# Patient Record
Sex: Female | Born: 1975 | Race: White | Hispanic: No | Marital: Married | Smoking: Never smoker
Health system: Southern US, Community
[De-identification: ages and names within clinical notes are randomized; demographics above are authoritative.]

## PROBLEM LIST (undated history)

## (undated) DIAGNOSIS — T7840XA Allergy, unspecified, initial encounter: Secondary | ICD-10-CM

## (undated) DIAGNOSIS — Z8619 Personal history of other infectious and parasitic diseases: Secondary | ICD-10-CM

## (undated) DIAGNOSIS — R519 Headache, unspecified: Secondary | ICD-10-CM

## (undated) DIAGNOSIS — R51 Headache: Secondary | ICD-10-CM

## (undated) HISTORY — PX: LASER ABLATION: SHX1947

## (undated) HISTORY — DX: Allergy, unspecified, initial encounter: T78.40XA

## (undated) HISTORY — DX: Personal history of other infectious and parasitic diseases: Z86.19

## (undated) HISTORY — DX: Headache: R51

## (undated) HISTORY — DX: Headache, unspecified: R51.9

---
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
## 2015-11-03 DIAGNOSIS — M2240 Chondromalacia patellae, unspecified knee: Secondary | ICD-10-CM | POA: Insufficient documentation

## 2015-11-03 DIAGNOSIS — M25469 Effusion, unspecified knee: Secondary | ICD-10-CM | POA: Insufficient documentation

## 2016-01-16 ENCOUNTER — Ambulatory Visit (INDEPENDENT_AMBULATORY_CARE_PROVIDER_SITE_OTHER): Payer: BLUE CROSS/BLUE SHIELD | Admitting: Internal Medicine

## 2016-01-16 ENCOUNTER — Encounter: Payer: Self-pay | Admitting: Internal Medicine

## 2016-01-16 VITALS — BP 116/72 | HR 65 | Temp 98.6°F | Ht 64.25 in | Wt 156.5 lb

## 2016-01-16 DIAGNOSIS — M542 Cervicalgia: Secondary | ICD-10-CM | POA: Diagnosis not present

## 2016-01-16 DIAGNOSIS — D509 Iron deficiency anemia, unspecified: Secondary | ICD-10-CM | POA: Diagnosis not present

## 2016-01-16 DIAGNOSIS — J301 Allergic rhinitis due to pollen: Secondary | ICD-10-CM

## 2016-01-16 DIAGNOSIS — D508 Other iron deficiency anemias: Secondary | ICD-10-CM | POA: Insufficient documentation

## 2016-01-16 DIAGNOSIS — M25561 Pain in right knee: Secondary | ICD-10-CM | POA: Diagnosis not present

## 2016-01-16 DIAGNOSIS — J302 Other seasonal allergic rhinitis: Secondary | ICD-10-CM | POA: Insufficient documentation

## 2016-01-16 LAB — IBC PANEL
Iron: 20 ug/dL — ABNORMAL LOW (ref 42–145)
Saturation Ratios: 4.2 % — ABNORMAL LOW (ref 20.0–50.0)
Transferrin: 341 mg/dL (ref 212.0–360.0)

## 2016-01-16 LAB — FERRITIN: Ferritin: 12.2 ng/mL (ref 10.0–291.0)

## 2016-01-16 MED ORDER — METHOCARBAMOL 500 MG PO TABS: 500.0000 mg | ORAL_TABLET | Freq: Every evening | ORAL | 0 refills | Status: DC | PRN

## 2016-01-16 NOTE — Patient Instructions (Signed)

## 2016-01-16 NOTE — Assessment & Plan Note (Signed)
Advised her to take Claritin OTC prn

## 2016-01-16 NOTE — Progress Notes (Signed)
HPI  Pt presents to the clinic today to establish care and for management of the conditions listed below. She recently moved from [place], transferring care from Dr. Seward Grater at [facility].   Seasonal Allergies: Worse in the spring. She does not take anything OTC for this.  Right knee pain: This has been going on for months. She had an xray of her knee which was norma. She had a MRI which showed some fraying of a tendon. She is following with Dr. Sabra Heck. She is taking Meloxicam as prescribed and reports it has helped with some of the pain and swelling. PT has been ordered but she reports she wants the swelling to decrease more before starting this.   She also reports that her HGB  Was 8.4 ast week when she went to donate blood. She reports she has a history of anemia in the past, unknown cause. She started taking her Iron pills again 1 week ago. She denies constipation, heavy menses, blood in her stool or any other s/s of abnormal bleeding.   She also c/o neck pain. This started 2.5 weeks ago. The pain is located on the sides of her neck, and radiate into the base of her skull. She describes the pain as sharp and shooting. It causes her to wake up with a headache in the back of her head. She describes the headache as pressure. She denies dizziness or visual changes. The neck pain seems worse when she tries to lay down for sleep. She has not taken anything OTC for this. She denies any injury to the area that she is aware of.  Past Medical History:  Diagnosis Date  . Allergy   . Frequent headaches   . History of chicken pox     Current Outpatient Prescriptions  Medication Sig Dispense Refill  . ferrous sulfate 325 (65 FE) MG tablet Take 325 mg by mouth daily with breakfast.    . meloxicam (MOBIC) 15 MG tablet Take 15 mg by mouth daily.     No current facility-administered medications for this visit.     No Known Allergies  Family History  Problem Relation Age of Onset  .  Uterine cancer Mother   . Hyperlipidemia Father   . Heart disease Father   . Hypertension Father   . Diabetes Father   . Diabetes Maternal Grandfather   . Alcohol abuse Paternal Grandmother   . Breast cancer Paternal Grandmother     Social History   Social History  . Marital status: Married    Spouse name: N/A  . Number of children: N/A  . Years of education: N/A   Occupational History  . Not on file.   Social History Main Topics  . Smoking status: Never Smoker  . Smokeless tobacco: Never Used  . Alcohol use Yes     Comment: occasional  . Drug use: No  . Sexual activity: Not on file   Other Topics Concern  . Not on file   Social History Narrative  . No narrative on file    ROS:  Constitutional: Pt reports headache. Denies fever, malaise, fatigue, or abrupt weight changes.  HEENT: Denies eye pain, eye redness, ear pain, ringing in the ears, wax buildup, runny nose, nasal congestion, bloody nose, or sore throat. Respiratory: Denies difficulty breathing, shortness of breath, cough or sputum production.   Cardiovascular: Denies chest pain, chest tightness, palpitations or swelling in the hands or feet.  Gastrointestinal: Denies abdominal pain, bloating, constipation, diarrhea or blood  in the stool.  GU: Denies frequency, urgency, pain with urination, blood in urine, odor or discharge. Musculoskeletal: Pt reports right knee swelling and neck pain. Denies decrease in range of motion, difficulty with gait, or joint pain.  Skin: Denies redness, rashes, lesions or ulcercations.  Neurological: Denies dizziness, difficulty with memory, difficulty with speech or problems with balance and coordination.  Psych: Denies anxiety, depression, SI/HI.  No other specific complaints in a complete review of systems (except as listed in HPI above).  PE:  BP 116/72   Pulse 65   Temp 98.6 F (37 C) (Oral)   Ht 5' 4.25" (1.632 m)   Wt 156 lb 8 oz (71 kg)   LMP 01/14/2016   SpO2 99%    BMI 26.65 kg/m  Wt Readings from Last 3 Encounters:  01/16/16 156 lb 8 oz (71 kg)    General: Appears her stated age, well developed, well nourished in NAD. HEENT: Head: normal shape and size; Throat/Mouth: Teeth present, mucosa pink and moist, no lesions or ulcerations noted.  Neck: Neck supple, trachea midline. No masses, lumps or thyromegaly present.  Cardiovascular: Normal rate and rhythm. S1,S2 noted.  No murmur, rubs or gallops noted.  Pulmonary/Chest: Normal effort and positive vesicular breath sounds. No respiratory distress. No wheezes, rales or ronchi noted.  Musculoskeletal: Normal flexion, extension and rotation of the cervical spine. No bony tenderness noted over the spine. Pain with palpation of the paracervical muscles. Pain with palpation of the trapezius muscle. Normal flexion and extension of the right knee. Peripatellar joint swelling noted. No difficulty with gait. Neurological: Alert and oriented.   Psychiatric: Mood and affect normal. Behavior is normal. Judgment and thought content normal.    Assessment and Plan:  Neck pain:  Muscular in origin Continue Meloxicam Neck exercises given, you may want to get a massage eRx for Robaxin 500 mg QHS   Right knee pain:  She will continue to follow with Dr. Sabra Heck  Make an appt for your annual exam Continue Meloxicam Knee exercise given  Webb Silversmith, NP

## 2016-01-16 NOTE — Assessment & Plan Note (Signed)
CBC, IBC panel and ferritin today Continue iron supplement for now- discussed constipation precautions

## 2016-01-17 LAB — CBC WITH DIFFERENTIAL/PLATELET
Basophils Absolute: 0.1 10*3/uL (ref 0.0–0.1)
Basophils Relative: 1.1 % (ref 0.0–3.0)
Eosinophils Absolute: 0.2 10*3/uL (ref 0.0–0.7)
Eosinophils Relative: 3.9 % (ref 0.0–5.0)
HCT: 28 % — ABNORMAL LOW (ref 36.0–46.0)
Hemoglobin: 8.8 g/dL — ABNORMAL LOW (ref 12.0–15.0)
Lymphocytes Relative: 26.2 % (ref 12.0–46.0)
Lymphs Abs: 1.7 10*3/uL (ref 0.7–4.0)
MCHC: 31.4 g/dL (ref 30.0–36.0)
MCV: 70.1 fl — ABNORMAL LOW (ref 78.0–100.0)
Monocytes Absolute: 0.4 10*3/uL (ref 0.1–1.0)
Monocytes Relative: 6.2 % (ref 3.0–12.0)
Neutro Abs: 4 10*3/uL (ref 1.4–7.7)
Neutrophils Relative %: 62.6 % (ref 43.0–77.0)
Platelets: 303 10*3/uL (ref 150.0–400.0)
RBC: 4 Mil/uL (ref 3.87–5.11)
RDW: 19.1 % — ABNORMAL HIGH (ref 11.5–15.5)
WBC: 6.4 10*3/uL (ref 4.0–10.5)

## 2016-01-20 NOTE — Addendum Note (Signed)
Addended by: Lurlean Nanny on: 01/20/2016 12:42 PM   Modules accepted: Orders

## 2016-04-22 ENCOUNTER — Other Ambulatory Visit (INDEPENDENT_AMBULATORY_CARE_PROVIDER_SITE_OTHER): Payer: BLUE CROSS/BLUE SHIELD

## 2016-04-22 DIAGNOSIS — D509 Iron deficiency anemia, unspecified: Secondary | ICD-10-CM | POA: Diagnosis not present

## 2016-04-22 LAB — CBC WITH DIFFERENTIAL/PLATELET
Basophils Absolute: 0 10*3/uL (ref 0.0–0.1)
Basophils Relative: 0.9 % (ref 0.0–3.0)
Eosinophils Absolute: 0.3 10*3/uL (ref 0.0–0.7)
Eosinophils Relative: 5.6 % — ABNORMAL HIGH (ref 0.0–5.0)
HCT: 31.5 % — ABNORMAL LOW (ref 36.0–46.0)
Hemoglobin: 10.2 g/dL — ABNORMAL LOW (ref 12.0–15.0)
Lymphocytes Relative: 26.9 % (ref 12.0–46.0)
Lymphs Abs: 1.5 10*3/uL (ref 0.7–4.0)
MCHC: 32.4 g/dL (ref 30.0–36.0)
MCV: 77.9 fl — ABNORMAL LOW (ref 78.0–100.0)
Monocytes Absolute: 0.4 10*3/uL (ref 0.1–1.0)
Monocytes Relative: 7.6 % (ref 3.0–12.0)
Neutro Abs: 3.2 10*3/uL (ref 1.4–7.7)
Neutrophils Relative %: 59 % (ref 43.0–77.0)
Platelets: 207 10*3/uL (ref 150.0–400.0)
RBC: 4.04 Mil/uL (ref 3.87–5.11)
RDW: 17.5 % — ABNORMAL HIGH (ref 11.5–15.5)
WBC: 5.5 10*3/uL (ref 4.0–10.5)

## 2016-04-22 LAB — IBC PANEL
Iron: 22 ug/dL — ABNORMAL LOW (ref 42–145)
Saturation Ratios: 4.6 % — ABNORMAL LOW (ref 20.0–50.0)
Transferrin: 340 mg/dL (ref 212.0–360.0)

## 2017-05-01 DIAGNOSIS — M25561 Pain in right knee: Secondary | ICD-10-CM | POA: Insufficient documentation

## 2017-05-01 DIAGNOSIS — M79606 Pain in leg, unspecified: Secondary | ICD-10-CM | POA: Insufficient documentation

## 2017-05-05 ENCOUNTER — Other Ambulatory Visit: Payer: Self-pay | Admitting: Internal Medicine

## 2017-05-05 DIAGNOSIS — Z1231 Encounter for screening mammogram for malignant neoplasm of breast: Secondary | ICD-10-CM

## 2017-05-23 ENCOUNTER — Encounter: Payer: Self-pay | Admitting: Radiology

## 2017-05-23 ENCOUNTER — Ambulatory Visit
Admission: RE | Admit: 2017-05-23 | Discharge: 2017-05-23 | Disposition: A | Discharge status: Home / Self Care | Payer: BLUE CROSS/BLUE SHIELD | Source: Ambulatory Visit | Attending: Internal Medicine | Admitting: Internal Medicine

## 2017-05-23 DIAGNOSIS — Z1231 Encounter for screening mammogram for malignant neoplasm of breast: Secondary | ICD-10-CM | POA: Insufficient documentation

## 2017-06-02 ENCOUNTER — Other Ambulatory Visit: Payer: Self-pay | Admitting: *Deleted

## 2017-06-02 ENCOUNTER — Inpatient Hospital Stay
Admission: RE | Admit: 2017-06-02 | Discharge: 2017-06-02 | Disposition: A | Discharge status: Home / Self Care | Payer: Self-pay | Source: Ambulatory Visit | Attending: *Deleted | Admitting: *Deleted

## 2017-06-02 DIAGNOSIS — Z9289 Personal history of other medical treatment: Secondary | ICD-10-CM

## 2017-07-15 DIAGNOSIS — Z01419 Encounter for gynecological examination (general) (routine) without abnormal findings: Secondary | ICD-10-CM | POA: Diagnosis not present

## 2017-10-07 DIAGNOSIS — Z23 Encounter for immunization: Secondary | ICD-10-CM | POA: Diagnosis not present

## 2017-11-12 DIAGNOSIS — G5603 Carpal tunnel syndrome, bilateral upper limbs: Secondary | ICD-10-CM | POA: Diagnosis not present

## 2017-11-14 DIAGNOSIS — L814 Other melanin hyperpigmentation: Secondary | ICD-10-CM | POA: Diagnosis not present

## 2017-11-14 DIAGNOSIS — Z1283 Encounter for screening for malignant neoplasm of skin: Secondary | ICD-10-CM | POA: Diagnosis not present

## 2017-11-14 DIAGNOSIS — D229 Melanocytic nevi, unspecified: Secondary | ICD-10-CM | POA: Diagnosis not present

## 2017-11-20 DIAGNOSIS — G5603 Carpal tunnel syndrome, bilateral upper limbs: Secondary | ICD-10-CM | POA: Diagnosis not present

## 2017-11-21 DIAGNOSIS — G5603 Carpal tunnel syndrome, bilateral upper limbs: Secondary | ICD-10-CM | POA: Diagnosis not present

## 2017-12-04 DIAGNOSIS — L290 Pruritus ani: Secondary | ICD-10-CM | POA: Diagnosis not present

## 2018-01-28 DIAGNOSIS — L819 Disorder of pigmentation, unspecified: Secondary | ICD-10-CM | POA: Diagnosis not present

## 2018-01-28 DIAGNOSIS — L739 Follicular disorder, unspecified: Secondary | ICD-10-CM | POA: Diagnosis not present

## 2018-04-01 DIAGNOSIS — L739 Follicular disorder, unspecified: Secondary | ICD-10-CM | POA: Diagnosis not present

## 2018-04-01 DIAGNOSIS — L819 Disorder of pigmentation, unspecified: Secondary | ICD-10-CM | POA: Diagnosis not present

## 2018-04-01 DIAGNOSIS — L814 Other melanin hyperpigmentation: Secondary | ICD-10-CM | POA: Diagnosis not present

## 2018-04-21 ENCOUNTER — Other Ambulatory Visit: Payer: Self-pay | Admitting: Internal Medicine

## 2018-04-21 DIAGNOSIS — Z1231 Encounter for screening mammogram for malignant neoplasm of breast: Secondary | ICD-10-CM

## 2018-05-04 DIAGNOSIS — Z0000 Encounter for general adult medical examination without abnormal findings: Secondary | ICD-10-CM | POA: Diagnosis not present

## 2018-05-04 DIAGNOSIS — Z79899 Other long term (current) drug therapy: Secondary | ICD-10-CM | POA: Diagnosis not present

## 2018-05-04 DIAGNOSIS — E7800 Pure hypercholesterolemia, unspecified: Secondary | ICD-10-CM | POA: Diagnosis not present

## 2018-05-04 DIAGNOSIS — D508 Other iron deficiency anemias: Secondary | ICD-10-CM | POA: Diagnosis not present

## 2018-05-04 DIAGNOSIS — F5104 Psychophysiologic insomnia: Secondary | ICD-10-CM | POA: Insufficient documentation

## 2018-05-25 DIAGNOSIS — D508 Other iron deficiency anemias: Secondary | ICD-10-CM | POA: Diagnosis not present

## 2018-06-17 DIAGNOSIS — D508 Other iron deficiency anemias: Secondary | ICD-10-CM | POA: Diagnosis not present

## 2018-06-18 DIAGNOSIS — D508 Other iron deficiency anemias: Secondary | ICD-10-CM | POA: Diagnosis not present

## 2018-08-17 ENCOUNTER — Ambulatory Visit
Admission: RE | Admit: 2018-08-17 | Discharge: 2018-08-17 | Disposition: A | Discharge status: Home / Self Care | Payer: 59 | Source: Ambulatory Visit | Attending: Internal Medicine | Admitting: Internal Medicine

## 2018-08-17 ENCOUNTER — Other Ambulatory Visit: Payer: Self-pay

## 2018-08-17 DIAGNOSIS — Z1231 Encounter for screening mammogram for malignant neoplasm of breast: Secondary | ICD-10-CM | POA: Diagnosis not present

## 2018-10-21 ENCOUNTER — Other Ambulatory Visit (INDEPENDENT_AMBULATORY_CARE_PROVIDER_SITE_OTHER): Payer: Self-pay | Admitting: Nurse Practitioner

## 2018-10-21 DIAGNOSIS — I8311 Varicose veins of right lower extremity with inflammation: Secondary | ICD-10-CM

## 2018-10-22 ENCOUNTER — Encounter (INDEPENDENT_AMBULATORY_CARE_PROVIDER_SITE_OTHER): Payer: Self-pay | Admitting: Nurse Practitioner

## 2018-10-22 ENCOUNTER — Ambulatory Visit (INDEPENDENT_AMBULATORY_CARE_PROVIDER_SITE_OTHER): Payer: 59

## 2018-10-22 ENCOUNTER — Ambulatory Visit (INDEPENDENT_AMBULATORY_CARE_PROVIDER_SITE_OTHER): Payer: 59 | Admitting: Nurse Practitioner

## 2018-10-22 ENCOUNTER — Other Ambulatory Visit: Payer: Self-pay

## 2018-10-22 VITALS — BP 119/78 | HR 72 | Resp 12 | Ht 64.0 in | Wt 159.0 lb

## 2018-10-22 DIAGNOSIS — E7800 Pure hypercholesterolemia, unspecified: Secondary | ICD-10-CM | POA: Diagnosis not present

## 2018-10-22 DIAGNOSIS — I8311 Varicose veins of right lower extremity with inflammation: Secondary | ICD-10-CM | POA: Diagnosis not present

## 2018-10-22 DIAGNOSIS — I8312 Varicose veins of left lower extremity with inflammation: Secondary | ICD-10-CM

## 2018-10-22 DIAGNOSIS — M25561 Pain in right knee: Secondary | ICD-10-CM | POA: Diagnosis not present

## 2018-10-22 NOTE — Progress Notes (Signed)
SUBJECTIVE:  Patient ID: Elizabeth Grant, female    DOB: [birth year], 43 y.o.   MRN: [id number] Chief Complaint  Patient presents with  . New Patient (Initial Visit)    HPI  Elizabeth Grant is a 43 y.o. female The patient is seen for evaluation of symptomatic varicose veins.  She is referred by her primary care physician Dr. Doy Hutching.  The patient relates burning and stinging which worsened steadily throughout the course of the day, particularly with standing. The patient also notes an aching and throbbing pain over the varicosities, particularly with prolonged dependent positions. The symptoms are significantly improved with elevation.  The patient also notes that during hot weather the symptoms are greatly intensified. The patient states the pain from the varicose veins interferes with work, daily exercise, shopping and household maintenance. At this point, the symptoms are persistent and severe enough that they're having a negative impact on lifestyle and are interfering with daily activities.  There is no history of DVT, PE or superficial thrombophlebitis. There is no history of ulceration or hemorrhage. The patient denies a significant family history of varicose veins. OB history: 3 children, most recent pregnancy 10 years ago  The patient has not worn graduated compression in the past. At the present time the patient has not been using over-the-counter analgesics. There is no history of prior surgical intervention or sclerotherapy.    Past Medical History:  Diagnosis Date  . Allergy   . Frequent headaches   . History of chicken pox     Past Surgical History:  Procedure Laterality Date  . TONSILLECTOMY AND ADENOIDECTOMY  1982    Social History   Socioeconomic History  . Marital status: Married    Spouse name: Not on file  . Number of children: Not on file  . Years of education: Not on file  . Highest education level: Not on file  Occupational History  . Not on file  Social  Needs  . Financial resource strain: Not on file  . Food insecurity    Worry: Not on file    Inability: Not on file  . Transportation needs    Medical: Not on file    Non-medical: Not on file  Tobacco Use  . Smoking status: Never Smoker  . Smokeless tobacco: Never Used  Substance and Sexual Activity  . Alcohol use: Yes    Comment: occasional  . Drug use: No  . Sexual activity: Yes  Lifestyle  . Physical activity    Days per week: Not on file    Minutes per session: Not on file  . Stress: Not on file  Relationships  . Social Herbalist on phone: Not on file    Gets together: Not on file    Attends religious service: Not on file    Active member of club or organization: Not on file    Attends meetings of clubs or organizations: Not on file    Relationship status: Not on file  . Intimate partner violence    Fear of current or ex partner: Not on file    Emotionally abused: Not on file    Physically abused: Not on file    Forced sexual activity: Not on file  Other Topics Concern  . Not on file  Social History Narrative  . Not on file    Family History  Problem Relation Age of Onset  . Uterine cancer Mother   . Hyperlipidemia Father   . Heart disease Father   .  Hypertension Father   . Diabetes Father   . Diabetes Maternal Grandfather   . Alcohol abuse Paternal Grandmother   . Breast cancer Paternal Grandmother     No Known Allergies   Review of Systems   Review of Systems: Negative Unless Checked Constitutional: [] Weight loss  [] Fever  [] Chills Cardiac: [] Chest pain   []  Atrial Fibrillation  [] Palpitations   [] Shortness of breath when laying flat   [] Shortness of breath with exertion. [] Shortness of breath at rest Vascular:  [] Pain in legs with walking   [] Pain in legs with standing [] Pain in legs when laying flat   [] Claudication    [] Pain in feet when laying flat    [] History of DVT   [] Phlebitis   [x] Swelling in legs   [x] Varicose veins    [] Non-healing ulcers Pulmonary:   [] Uses home oxygen   [] Productive cough   [] Hemoptysis   [] Wheeze  [] COPD   [] Asthma Neurologic:  [] Dizziness   [] Seizures  [] Blackouts [] History of stroke   [] History of TIA  [] Aphasia   [] Temporary Blindness   [] Weakness or numbness in arm   [] Weakness or numbness in leg Musculoskeletal:   [] Joint swelling   [] Joint pain   [] Low back pain  []  History of Knee Replacement [x] Arthritis [] back Surgeries  []  Spinal Stenosis    Hematologic:  [] Easy bruising  [] Easy bleeding   [] Hypercoagulable state   [] Anemic Gastrointestinal:  [] Diarrhea   [] Vomiting  [] Gastroesophageal reflux/heartburn   [] Difficulty swallowing. [] Abdominal pain Genitourinary:  [] Chronic kidney disease   [] Difficult urination  [] Anuric   [] Blood in urine [] Frequent urination  [] Burning with urination   [] Hematuria Skin:  [] Rashes   [] Ulcers [] Wounds Psychological:  [] History of anxiety   []  History of major depression  []  Memory Difficulties      OBJECTIVE:   Physical Exam  BP 119/78 (BP Location: Left Arm, Patient Position: Sitting, Cuff Size: Normal)   Pulse 72   Resp 12   Ht 5\' 4"  (1.626 m)   Wt 159 lb (72.1 kg)   BMI 27.29 kg/m   Gen: WD/WN, NAD Head: [place]/AT, No temporalis wasting.  Ear/Nose/Throat: Hearing grossly intact, nares w/o erythema or drainage Eyes: PER, EOMI, sclera nonicteric.  Neck: Supple, no masses.  No JVD.  Pulmonary:  Good air movement, no use of accessory muscles.  Cardiac: RRR Vascular: scattered varicosities present bilaterally.  Mild venous stasis changes to the legs bilaterally.  2+ soft edema  Vessel Right Left  Radial Palpable Palpable  Dorsalis Pedis Palpable Palpable  Posterior Tibial Palpable Palpable   Gastrointestinal: soft, non-distended. No guarding/no peritoneal signs.  Musculoskeletal: M/S 5/5 throughout.  No deformity or atrophy.  Neurologic: Pain and light touch intact in extremities.  Symmetrical.  Speech is fluent. Motor exam as listed  above. Psychiatric: Judgment intact, Mood & affect appropriate for pt's clinical situation. Dermatologic: No Venous rashes. No Ulcers Noted.  No changes consistent with cellulitis. Lymph : No Cervical lymphadenopathy, no lichenification or skin changes of chronic lymphedema.       ASSESSMENT AND PLAN:  1. Varicose veins of both lower extremities with inflammation  Recommend:  The patient has large symptomatic varicose veins that are painful and associated with swelling.  I have had a long discussion with the patient regarding  varicose veins and why they cause symptoms.  Patient will begin wearing graduated compression stockings class 1 on a daily basis, beginning first thing in the morning and removing them in the evening. The patient is instructed specifically not  to sleep in the stockings.    The patient  will also begin using over-the-counter analgesics such as Motrin 600 mg po TID to help control the symptoms.    In addition, behavioral modification including elevation during the day will be initiated.    Pending the results of these changes the  patient will be reevaluated in three months.   An  ultrasound of the venous system will be obtained.   Further plans will be based on the ultrasound results and whether conservative therapies are successful at eliminating the pain and swelling.   2. Pure hypercholesterolemia Continue statin as ordered and reviewed, no changes at this time   3. Arthralgia of right knee Continue NSAID medications as already ordered, these medications have been reviewed and there are no changes at this time.  Continued activity and therapy was stressed.    Current Outpatient Medications on File Prior to Visit  Medication Sig Dispense Refill  . Calcium-Vitamin D 500-125 MG-UNIT TABS Take by mouth.    . ferrous sulfate 325 (65 FE) MG tablet Take 325 mg by mouth daily with breakfast.    . meloxicam (MOBIC) 15 MG tablet Take 15 mg by mouth daily. PRN      No current facility-administered medications on file prior to visit.     There are no Patient Instructions on file for this visit. Return in about 3 months (around 01/21/2019) for Varicose veins.   Kris Hartmann, NP  This note was completed with Sales executive.  Any errors are purely unintentional.

## 2018-12-23 DIAGNOSIS — G5603 Carpal tunnel syndrome, bilateral upper limbs: Secondary | ICD-10-CM | POA: Insufficient documentation

## 2019-02-01 ENCOUNTER — Ambulatory Visit (INDEPENDENT_AMBULATORY_CARE_PROVIDER_SITE_OTHER): Payer: 59 | Admitting: Vascular Surgery

## 2019-02-01 ENCOUNTER — Other Ambulatory Visit: Payer: Self-pay

## 2019-02-01 ENCOUNTER — Encounter (INDEPENDENT_AMBULATORY_CARE_PROVIDER_SITE_OTHER): Payer: Self-pay | Admitting: Vascular Surgery

## 2019-02-01 VITALS — BP 118/78 | HR 77 | Resp 16 | Wt 166.0 lb

## 2019-02-01 DIAGNOSIS — I872 Venous insufficiency (chronic) (peripheral): Secondary | ICD-10-CM | POA: Diagnosis not present

## 2019-02-01 DIAGNOSIS — E7800 Pure hypercholesterolemia, unspecified: Secondary | ICD-10-CM | POA: Diagnosis not present

## 2019-02-01 DIAGNOSIS — M79605 Pain in left leg: Secondary | ICD-10-CM | POA: Diagnosis not present

## 2019-02-01 DIAGNOSIS — M79604 Pain in right leg: Secondary | ICD-10-CM

## 2019-02-01 NOTE — Progress Notes (Signed)
MRN : [id number]  Elizabeth Grant is a 43 y.o. (05-Dec-1975) female who presents with chief complaint of  Chief Complaint  Patient presents with  . Follow-up    73month follow up  .  History of Present Illness:   The patient returns for followup evaluation more than 3 months after the initial visit. The patient continues to have pain in the lower extremities with dependency. The pain is lessened with elevation. Graduated compression stockings, Class I (20-30 mmHg), have been worn but the stockings do not eliminate the leg pain. Over-the-counter analgesics do not improve the symptoms. The degree of discomfort continues to interfere with daily activities. The patient notes the pain in the legs is causing problems with daily exercise, at the workplace and even with household activities and maintenance such as standing in the kitchen preparing meals and doing dishes.   Venous ultrasound shows normal deep venous system, no evidence of acute or chronic DVT.  Superficial reflux is present in the bilateral Great Saphenous Veins  Current Meds  Medication Sig  . Calcium-Vitamin D 500-125 MG-UNIT TABS Take by mouth.  . ferrous sulfate 325 (65 FE) MG tablet Take 325 mg by mouth daily with breakfast.  . meloxicam (MOBIC) 15 MG tablet Take 15 mg by mouth daily. PRN    Past Medical History:  Diagnosis Date  . Allergy   . Frequent headaches   . History of chicken pox     Past Surgical History:  Procedure Laterality Date  . TONSILLECTOMY AND ADENOIDECTOMY  1982    Social History Social History   Tobacco Use  . Smoking status: Never Smoker  . Smokeless tobacco: Never Used  Substance Use Topics  . Alcohol use: Yes    Comment: occasional  . Drug use: No    Family History Family History  Problem Relation Age of Onset  . Uterine cancer Mother   . Hyperlipidemia Father   . Heart disease Father   . Hypertension Father   . Diabetes Father   . Diabetes Maternal Grandfather   . Alcohol  abuse Paternal Grandmother   . Breast cancer Paternal Grandmother     No Known Allergies   REVIEW OF SYSTEMS (Negative unless checked)  Constitutional: [] Weight loss  [] Fever  [] Chills Cardiac: [] Chest pain   [] Chest pressure   [] Palpitations   [] Shortness of breath when laying flat   [] Shortness of breath with exertion. Vascular:  [] Pain in legs with walking   [x] Pain in legs at rest  [] History of DVT   [] Phlebitis   [x] Swelling in legs   [x] Varicose veins   [] Non-healing ulcers Pulmonary:   [] Uses home oxygen   [] Productive cough   [] Hemoptysis   [] Wheeze  [] COPD   [] Asthma Neurologic:  [] Dizziness   [] Seizures   [] History of stroke   [] History of TIA  [] Aphasia   [] Vissual changes   [] Weakness or numbness in arm   [] Weakness or numbness in leg Musculoskeletal:   [] Joint swelling   [] Joint pain   [] Low back pain Hematologic:  [] Easy bruising  [] Easy bleeding   [] Hypercoagulable state   [] Anemic Gastrointestinal:  [] Diarrhea   [] Vomiting  [] Gastroesophageal reflux/heartburn   [] Difficulty swallowing. Genitourinary:  [] Chronic kidney disease   [] Difficult urination  [] Frequent urination   [] Blood in urine Skin:  [x] Rashes   [] Ulcers  Psychological:  [] History of anxiety   []  History of major depression.  Physical Examination  Vitals:   02/01/19 1504  BP: 118/78  Pulse: 77  Resp: 16  Weight: 166 lb (75.3 kg)   Body mass index is 28.49 kg/m. Gen: WD/WN, NAD Head: [place]/AT, No temporalis wasting.  Ear/Nose/Throat: Hearing grossly intact, nares w/o erythema or drainage Eyes: PER, EOMI, sclera nonicteric.  Neck: Supple, no large masses.   Pulmonary:  Good air movement, no audible wheezing bilaterally, no use of accessory muscles.  Cardiac: RRR, no JVD Vascular: Large varicosities present extensively greater than 10 mm bilaterally.  Mild to moderate venous stasis changes to the legs bilaterally.  2-3+ soft pitting edema Vessel Right Left  PT Palpable Palpable  DP Palpable Palpable    Gastrointestinal: Non-distended. No guarding/no peritoneal signs.  Musculoskeletal: M/S 5/5 throughout.  No deformity or atrophy.  Neurologic: CN 2-12 intact. Symmetrical.  Speech is fluent. Motor exam as listed above. Psychiatric: Judgment intact, Mood & affect appropriate for pt's clinical situation. Dermatologic: No rashes or ulcers noted.  No changes consistent with cellulitis. Lymph : No lichenification or skin changes of chronic lymphedema.  CBC Lab Results  Component Value Date   WBC 5.5 04/22/2016   HGB 10.2 (L) 04/22/2016   HCT 31.5 (L) 04/22/2016   MCV 77.9 (L) 04/22/2016   PLT 207.0 04/22/2016    BMET No results found for: NA, K, CL, CO2, GLUCOSE, BUN, CREATININE, CALCIUM, GFRNONAA, GFRAA CrCl cannot be calculated (No successful lab value found.).  COAG No results found for: INR, PROTIME  Radiology No results found.   Assessment/Plan 1. Chronic venous insufficiency Recommend  I have reviewed my previous  discussion with the patient regarding  varicose veins and why they cause symptoms. Patient will continue  wearing graduated compression stockings class 1 on a daily basis, beginning first thing in the morning and removing them in the evening.    In addition, behavioral modification including elevation during the day was again discussed and this will continue.  The patient has utilized over the counter pain medications and has been exercising.  However, at this time conservative therapy has not alleviated the patient's symptoms of leg pain and swelling  Recommend: laser ablation of the right and then left great saphenous veins to eliminate the symptoms of pain and swelling of the lower extremities caused by the severe superficial venous reflux disease.   2. Pain in both lower extremities See #1  3. Pure hypercholesterolemia Continue statin as ordered and reviewed, no changes at this time    Hortencia Pilar, MD  02/01/2019 3:16 PM

## 2019-02-28 ENCOUNTER — Encounter (INDEPENDENT_AMBULATORY_CARE_PROVIDER_SITE_OTHER): Payer: Self-pay | Admitting: Vascular Surgery

## 2019-02-28 DIAGNOSIS — I872 Venous insufficiency (chronic) (peripheral): Secondary | ICD-10-CM | POA: Insufficient documentation

## 2019-04-01 ENCOUNTER — Other Ambulatory Visit: Payer: Self-pay

## 2019-04-01 ENCOUNTER — Ambulatory Visit (INDEPENDENT_AMBULATORY_CARE_PROVIDER_SITE_OTHER): Payer: 59 | Admitting: Vascular Surgery

## 2019-04-01 ENCOUNTER — Encounter (INDEPENDENT_AMBULATORY_CARE_PROVIDER_SITE_OTHER): Payer: Self-pay | Admitting: Vascular Surgery

## 2019-04-01 DIAGNOSIS — I8312 Varicose veins of left lower extremity with inflammation: Secondary | ICD-10-CM

## 2019-04-01 DIAGNOSIS — I8311 Varicose veins of right lower extremity with inflammation: Secondary | ICD-10-CM | POA: Diagnosis not present

## 2019-04-01 NOTE — Progress Notes (Signed)
    MRN : [id number]  Elizabeth Grant is a 44 y.o. (20-Jan-1976) female who presents with chief complaint of painful varicose veins.    The patient's right lower extremity was sterilely prepped and draped.  The ultrasound machine was used to visualize the great saphenous vein throughout its course.  A segment at the knee level was selected for access.  The saphenous vein was accessed without difficulty using ultrasound guidance with a micropuncture needle.   An 0.018  wire was placed beyond the saphenofemoral junction through the sheath and the microneedle was removed.  The 65 cm sheath was then placed over the wire and the wire and dilator were removed.  The laser fiber was placed through the sheath and its tip was placed approximately 2 cm below the saphenofemoral junction.  Tumescent anesthesia was then created with a dilute lidocaine solution.  Laser energy was then delivered with constant withdrawal of the sheath and laser fiber.  Approximately 1391 joules of energy were delivered over a length of 35 cm.  Sterile dressings were placed.  The patient tolerated the procedure well without complications.

## 2019-04-05 ENCOUNTER — Other Ambulatory Visit (INDEPENDENT_AMBULATORY_CARE_PROVIDER_SITE_OTHER): Payer: Self-pay | Admitting: Vascular Surgery

## 2019-04-05 ENCOUNTER — Other Ambulatory Visit: Payer: Self-pay

## 2019-04-05 ENCOUNTER — Ambulatory Visit (INDEPENDENT_AMBULATORY_CARE_PROVIDER_SITE_OTHER): Payer: 59

## 2019-04-05 DIAGNOSIS — Z9889 Other specified postprocedural states: Secondary | ICD-10-CM | POA: Diagnosis not present

## 2019-04-22 ENCOUNTER — Other Ambulatory Visit: Payer: Self-pay

## 2019-04-22 ENCOUNTER — Encounter (INDEPENDENT_AMBULATORY_CARE_PROVIDER_SITE_OTHER): Payer: Self-pay | Admitting: Vascular Surgery

## 2019-04-22 ENCOUNTER — Ambulatory Visit (INDEPENDENT_AMBULATORY_CARE_PROVIDER_SITE_OTHER): Payer: 59 | Admitting: Vascular Surgery

## 2019-04-22 VITALS — BP 106/71 | HR 80 | Resp 6 | Ht 64.0 in | Wt 164.0 lb

## 2019-04-22 DIAGNOSIS — I8311 Varicose veins of right lower extremity with inflammation: Secondary | ICD-10-CM | POA: Diagnosis not present

## 2019-04-22 DIAGNOSIS — I8312 Varicose veins of left lower extremity with inflammation: Secondary | ICD-10-CM

## 2019-04-23 ENCOUNTER — Encounter (INDEPENDENT_AMBULATORY_CARE_PROVIDER_SITE_OTHER): Payer: Self-pay | Admitting: Vascular Surgery

## 2019-04-23 NOTE — Progress Notes (Signed)
    MRN : [id number]  Mumina Highbaugh is a 44 y.o. (19-Jun-1975) female who presents with chief complaint of painful varicose veins.    The patient's left lower extremity was sterilely prepped and draped.  The ultrasound machine was used to visualize the left great saphenous saphenous vein throughout its course.  A segment of the great saphenous vein below the knee was selected for access.  The saphenous vein was accessed without difficulty using ultrasound guidance with a micropuncture needle.   An 0.018  wire was placed beyond the saphenofemoral junction through the sheath and the microneedle was removed.  The 65 cm sheath was then placed over the wire and the wire and dilator were removed.  The laser fiber was placed through the sheath and its tip was placed approximately 2 cm below the saphenofemoral junction.  Tumescent anesthesia was then created with a dilute lidocaine solution.  Laser energy was then delivered with constant withdrawal of the sheath and laser fiber.  Approximately 1641 joules of energy were delivered over a length of 31 cm.  Sterile dressings were placed.  The patient tolerated the procedure well without complications.

## 2019-04-26 ENCOUNTER — Other Ambulatory Visit: Payer: Self-pay

## 2019-04-26 ENCOUNTER — Other Ambulatory Visit (INDEPENDENT_AMBULATORY_CARE_PROVIDER_SITE_OTHER): Payer: Self-pay | Admitting: Vascular Surgery

## 2019-04-26 ENCOUNTER — Ambulatory Visit (INDEPENDENT_AMBULATORY_CARE_PROVIDER_SITE_OTHER): Payer: 59

## 2019-04-26 DIAGNOSIS — I83812 Varicose veins of left lower extremities with pain: Secondary | ICD-10-CM

## 2019-05-17 ENCOUNTER — Ambulatory Visit (INDEPENDENT_AMBULATORY_CARE_PROVIDER_SITE_OTHER): Payer: 59 | Admitting: Vascular Surgery

## 2019-05-17 ENCOUNTER — Encounter (INDEPENDENT_AMBULATORY_CARE_PROVIDER_SITE_OTHER): Payer: Self-pay | Admitting: Vascular Surgery

## 2019-05-17 ENCOUNTER — Other Ambulatory Visit: Payer: Self-pay

## 2019-05-17 VITALS — BP 119/81 | HR 76 | Resp 16 | Wt 167.0 lb

## 2019-05-17 DIAGNOSIS — I872 Venous insufficiency (chronic) (peripheral): Secondary | ICD-10-CM | POA: Diagnosis not present

## 2019-05-17 DIAGNOSIS — E7800 Pure hypercholesterolemia, unspecified: Secondary | ICD-10-CM | POA: Diagnosis not present

## 2019-05-17 DIAGNOSIS — I8312 Varicose veins of left lower extremity with inflammation: Secondary | ICD-10-CM

## 2019-05-17 DIAGNOSIS — I8311 Varicose veins of right lower extremity with inflammation: Secondary | ICD-10-CM | POA: Diagnosis not present

## 2019-05-22 ENCOUNTER — Encounter (INDEPENDENT_AMBULATORY_CARE_PROVIDER_SITE_OTHER): Payer: Self-pay | Admitting: Vascular Surgery

## 2019-05-22 NOTE — Progress Notes (Signed)
MRN : [id number]  Elizabeth Grant is a 44 y.o. (06-20-1975) female who presents with chief complaint of  Chief Complaint  Patient presents with  . Follow-up    post laser follow up  .  History of Present Illness:   The patient returns to the office for followup status post laser ablation. The patient notes multiple residual varicosities bilaterally which continued to hurt with dependent positions and remained tender to palpation. The patient's swelling is unchanged from preoperative status. The patient continues to wear graduated compression stockings on a daily basis but these are not eliminating the pain and discomfort. The patient continues to use over-the-counter anti-inflammatory medications to treat the pain and related symptoms but this has not given the patient relief. The patient notes the pain in the lower extremities is causing problems with daily exercise, problems at work and even with household activities such as preparing meals and doing dishes.  The patient is otherwise done well and there have been no complications related to the laser procedure or interval changes in the patient's overall   Venous ultrasound post laser shows successful laser ablation of the saphenous veins bilaterally, no DVT identified.  Current Meds  Medication Sig  . Calcium-Vitamin D 500-125 MG-UNIT TABS Take by mouth.  . ferrous sulfate 325 (65 FE) MG tablet Take 325 mg by mouth daily with breakfast.  . meloxicam (MOBIC) 15 MG tablet Take 15 mg by mouth daily. PRN    Past Medical History:  Diagnosis Date  . Allergy   . Frequent headaches   . History of chicken pox     Past Surgical History:  Procedure Laterality Date  . LASER ABLATION Bilateral   . TONSILLECTOMY AND ADENOIDECTOMY  1982    Social History Social History   Tobacco Use  . Smoking status: Never Smoker  . Smokeless tobacco: Never Used  Substance Use Topics  . Alcohol use: Yes    Comment: occasional  . Drug use: No     Family History Family History  Problem Relation Age of Onset  . Uterine cancer Mother   . Hyperlipidemia Father   . Heart disease Father   . Hypertension Father   . Diabetes Father   . Diabetes Maternal Grandfather   . Alcohol abuse Paternal Grandmother   . Breast cancer Paternal Grandmother     No Known Allergies   REVIEW OF SYSTEMS (Negative unless checked)  Constitutional: [] Weight loss  [] Fever  [] Chills Cardiac: [] Chest pain   [] Chest pressure   [] Palpitations   [] Shortness of breath when laying flat   [] Shortness of breath with exertion. Vascular:  [] Pain in legs with walking   [x] Pain in legs at rest  [] History of DVT   [] Phlebitis   [] Swelling in legs   [x] Varicose veins   [] Non-healing ulcers Pulmonary:   [] Uses home oxygen   [] Productive cough   [] Hemoptysis   [] Wheeze  [] COPD   [] Asthma Neurologic:  [] Dizziness   [] Seizures   [] History of stroke   [] History of TIA  [] Aphasia   [] Vissual changes   [] Weakness or numbness in arm   [] Weakness or numbness in leg Musculoskeletal:   [] Joint swelling   [] Joint pain   [] Low back pain Hematologic:  [] Easy bruising  [] Easy bleeding   [] Hypercoagulable state   [] Anemic Gastrointestinal:  [] Diarrhea   [] Vomiting  [x] Gastroesophageal reflux/heartburn   [] Difficulty swallowing. Genitourinary:  [] Chronic kidney disease   [] Difficult urination  [] Frequent urination   [] Blood in urine Skin:  [] Rashes   []   Ulcers  Psychological:  [] History of anxiety   []  History of major depression.  Physical Examination  Vitals:   05/17/19 1619  BP: 119/81  Pulse: 76  Resp: 16  Weight: 167 lb (75.8 kg)   Body mass index is 28.67 kg/m. Gen: WD/WN, NAD Head: [place]/AT, No temporalis wasting.  Ear/Nose/Throat: Hearing grossly intact, nares w/o erythema or drainage Eyes: PER, EOMI, sclera nonicteric.  Neck: Supple, no large masses.   Pulmonary:  Good air movement, no audible wheezing bilaterally, no use of accessory muscles.  Cardiac: RRR, no  JVD Vascular: Large varicosities present extensively greater than 10 mm bilaterally.  Mild venous stasis changes to the legs bilaterally.  2+ soft pitting edema Vessel Right Left  Radial Palpable Palpable  PT Palpable Palpable  DP Palpable Palpable  Gastrointestinal: Non-distended. No guarding/no peritoneal signs.  Musculoskeletal: M/S 5/5 throughout.  No deformity or atrophy.  Neurologic: CN 2-12 intact. Symmetrical.  Speech is fluent. Motor exam as listed above. Psychiatric: Judgment intact, Mood & affect appropriate for pt's clinical situation. Dermatologic: No rashes or ulcers noted.  No changes consistent with cellulitis.   CBC Lab Results  Component Value Date   WBC 5.5 04/22/2016   HGB 10.2 (L) 04/22/2016   HCT 31.5 (L) 04/22/2016   MCV 77.9 (L) 04/22/2016   PLT 207.0 04/22/2016    BMET No results found for: NA, K, CL, CO2, GLUCOSE, BUN, CREATININE, CALCIUM, GFRNONAA, GFRAA CrCl cannot be calculated (No successful lab value found.).  COAG No results found for: INR, PROTIME  Radiology VAS [place] LOWER EXTREMITY VENOUS POST ABLATION  Result Date: 04/26/2019  Lower Venous Reflux Study Indications: S/P ablation. Other Indications: Left GSV laser ablation on 04/22/19; Right GSV laser ablation                    on 04/01/19. Performing Technologist: Blondell Reveal RT, RDMS, RVT  Examination Guidelines: A complete evaluation includes B-mode imaging, spectral Doppler, color Doppler, and power Doppler as needed of all accessible portions of each vessel. Bilateral testing is considered an integral part of a complete examination. Limited examinations for reoccurring indications may be performed as noted. The reflux portion of the exam is performed with the patient in reverse Trendelenburg. Significant venous reflux is defined as >500 ms in the superficial venous system, and >1 second in the deep venous system.   Summary: Left: - No evidence of deep vein thrombosis seen in the left lower  extremity, from the common femoral through the popliteal veins. - The GSV appears occluded from the knee to roughly 3.3cm from the SFJ.  *See table(s) above for measurements and observations. Electronically signed by Hortencia Pilar MD on 04/26/2019 at 5:15:18 PM.    Final       Assessment/Plan 1. Varicose veins of both lower extremities with inflammation Recommend:  The patient has had successful ablation of the previously incompetent saphenous venous system but still has persistent symptoms of pain and swelling that are having a negative impact on daily life and daily activities.  Patient should undergo injection sclerotherapy to treat the residual varicosities.  The risks, benefits and alternative therapies were reviewed in detail with the patient.  All questions were answered.  The patient agrees to proceed with sclerotherapy at their convenience.  The patient will continue wearing the graduated compression stockings and using the over-the-counter pain medications to treat her symptoms.     2. Chronic venous insufficiency No surgery or intervention at this point in time.  I have had a long discussion with the patient regarding venous insufficiency and why it  causes symptoms. I have discussed with the patient the chronic skin changes that accompany venous insufficiency and the long term sequela such as infection and ulceration.  Patient will begin wearing graduated compression stockings class 1 (20-30 mmHg) or compression wraps on a daily basis a prescription was given. The patient will put the stockings on first thing in the morning and removing them in the evening. The patient is instructed specifically not to sleep in the stockings.    In addition, behavioral modification including several periods of elevation of the lower extremities during the day will be continued. I have demonstrated that proper elevation is a position with the ankles at heart level.  The patient is instructed to  begin routine exercise, especially walking on a daily basis  Patient should undergo duplex ultrasound of the venous system to ensure that DVT or reflux is not present.  Following the review of the ultrasound the patient will follow up in 2-3 months to reassess the degree of swelling and the control that graduated compression stockings or compression wraps  is offering.   The patient can be assessed for a Lymph Pump at that time  3. Pure hypercholesterolemia Continue statin as ordered and reviewed, no changes at this time    Hortencia Pilar, MD  05/22/2019 1:14 PM

## 2019-06-02 ENCOUNTER — Ambulatory Visit (INDEPENDENT_AMBULATORY_CARE_PROVIDER_SITE_OTHER): Payer: 59 | Admitting: Vascular Surgery

## 2019-06-02 ENCOUNTER — Encounter (INDEPENDENT_AMBULATORY_CARE_PROVIDER_SITE_OTHER): Payer: Self-pay | Admitting: Vascular Surgery

## 2019-06-02 ENCOUNTER — Other Ambulatory Visit: Payer: Self-pay

## 2019-06-02 VITALS — BP 122/83 | HR 73 | Resp 16 | Wt 161.4 lb

## 2019-06-02 DIAGNOSIS — I8311 Varicose veins of right lower extremity with inflammation: Secondary | ICD-10-CM | POA: Diagnosis not present

## 2019-06-02 DIAGNOSIS — I872 Venous insufficiency (chronic) (peripheral): Secondary | ICD-10-CM | POA: Diagnosis not present

## 2019-06-02 DIAGNOSIS — I8312 Varicose veins of left lower extremity with inflammation: Secondary | ICD-10-CM

## 2019-06-02 NOTE — Progress Notes (Signed)
Varicose veins of bilateral  lower extremity with inflammation (454.1  I83.10) Current Plans   Indication: Patient presents with symptomatic varicose veins of the bilateral  lower extremity.   Procedure: Sclerotherapy using hypertonic saline mixed with 1% Lidocaine was performed on the bilateral lower extremity. Compression wraps were placed. The patient tolerated the procedure well. 

## 2019-06-19 ENCOUNTER — Ambulatory Visit: Payer: 59

## 2019-06-30 ENCOUNTER — Ambulatory Visit (INDEPENDENT_AMBULATORY_CARE_PROVIDER_SITE_OTHER): Payer: 59 | Admitting: Vascular Surgery

## 2019-07-02 ENCOUNTER — Ambulatory Visit: Payer: 59

## 2019-07-07 ENCOUNTER — Ambulatory Visit (INDEPENDENT_AMBULATORY_CARE_PROVIDER_SITE_OTHER): Payer: 59 | Admitting: Vascular Surgery

## 2019-07-09 ENCOUNTER — Ambulatory Visit: Payer: 59 | Attending: Internal Medicine

## 2019-07-09 DIAGNOSIS — Z23 Encounter for immunization: Secondary | ICD-10-CM

## 2019-07-09 NOTE — Progress Notes (Signed)
   Covid-19 Vaccination Clinic  Name:  Elizabeth Grant    MRN: [id number] DOB: 1976-02-02  07/09/2019  [name] was observed post Covid-19 immunization for 15 minutes without incident. She was provided with Vaccine Information Sheet and instruction to access the V-Safe system.   [name] was instructed to call 911 with any severe reactions post vaccine: Marland Kitchen Difficulty breathing  . Swelling of face and throat  . A fast heartbeat  . A bad rash all over body  . Dizziness and weakness   Immunizations Administered    Name Date Dose VIS Date Route   Pfizer COVID-19 Vaccine 07/09/2019  8:31 AM 0.3 mL 04/14/2018 Intramuscular   Manufacturer: [place]   Lot: [id number]   Person: [id number]

## 2019-07-14 ENCOUNTER — Ambulatory Visit (INDEPENDENT_AMBULATORY_CARE_PROVIDER_SITE_OTHER): Payer: 59 | Admitting: Vascular Surgery

## 2019-07-14 ENCOUNTER — Other Ambulatory Visit: Payer: Self-pay

## 2019-07-14 ENCOUNTER — Encounter (INDEPENDENT_AMBULATORY_CARE_PROVIDER_SITE_OTHER): Payer: Self-pay | Admitting: Vascular Surgery

## 2019-07-14 VITALS — BP 117/79 | HR 79 | Resp 16 | Wt 163.0 lb

## 2019-07-14 DIAGNOSIS — I8311 Varicose veins of right lower extremity with inflammation: Secondary | ICD-10-CM | POA: Diagnosis not present

## 2019-07-14 DIAGNOSIS — I872 Venous insufficiency (chronic) (peripheral): Secondary | ICD-10-CM

## 2019-07-14 DIAGNOSIS — I8312 Varicose veins of left lower extremity with inflammation: Secondary | ICD-10-CM

## 2019-07-14 NOTE — Progress Notes (Signed)
Varicose veins of bilateral  lower extremity with inflammation (454.1  I83.10) Current Plans   Indication: Patient presents with symptomatic varicose veins of the bilateral  lower extremity.   Procedure: Sclerotherapy using hypertonic saline mixed with 1% Lidocaine was performed on the bilateral lower extremity. Compression wraps were placed. The patient tolerated the procedure well. 

## 2019-07-26 ENCOUNTER — Other Ambulatory Visit: Payer: Self-pay | Admitting: Internal Medicine

## 2019-07-26 DIAGNOSIS — Z1231 Encounter for screening mammogram for malignant neoplasm of breast: Secondary | ICD-10-CM

## 2019-07-28 ENCOUNTER — Ambulatory Visit (INDEPENDENT_AMBULATORY_CARE_PROVIDER_SITE_OTHER): Payer: 59 | Admitting: Vascular Surgery

## 2019-07-30 ENCOUNTER — Ambulatory Visit: Payer: 59 | Attending: Internal Medicine

## 2019-07-30 DIAGNOSIS — Z23 Encounter for immunization: Secondary | ICD-10-CM

## 2019-07-30 NOTE — Progress Notes (Signed)
   Covid-19 Vaccination Clinic  Name:  Elizabeth Grant    MRN: [id number] DOB: 11/01/1975  07/30/2019  [name] was observed post Covid-19 immunization for 15 minutes without incident. She was provided with Vaccine Information Sheet and instruction to access the V-Safe system.   [name] was instructed to call 911 with any severe reactions post vaccine: Marland Kitchen Difficulty breathing  . Swelling of face and throat  . A fast heartbeat  . A bad rash all over body  . Dizziness and weakness   Immunizations Administered    Name Date Dose VIS Date Route   Pfizer COVID-19 Vaccine 07/30/2019  9:06 AM 0.3 mL 04/14/2018 Intramuscular   Manufacturer: [place]   Lot: [id number]   [place]: [id number]

## 2019-08-11 ENCOUNTER — Ambulatory Visit (INDEPENDENT_AMBULATORY_CARE_PROVIDER_SITE_OTHER): Payer: 59 | Admitting: Vascular Surgery

## 2019-09-01 ENCOUNTER — Ambulatory Visit (INDEPENDENT_AMBULATORY_CARE_PROVIDER_SITE_OTHER): Payer: 59 | Admitting: Vascular Surgery

## 2019-09-08 ENCOUNTER — Encounter (INDEPENDENT_AMBULATORY_CARE_PROVIDER_SITE_OTHER): Payer: Self-pay | Admitting: Vascular Surgery

## 2019-09-08 ENCOUNTER — Other Ambulatory Visit: Payer: Self-pay

## 2019-09-08 ENCOUNTER — Ambulatory Visit (INDEPENDENT_AMBULATORY_CARE_PROVIDER_SITE_OTHER): Payer: 59 | Admitting: Vascular Surgery

## 2019-09-08 VITALS — BP 111/77 | HR 87 | Resp 16 | Wt 163.0 lb

## 2019-09-08 DIAGNOSIS — I8312 Varicose veins of left lower extremity with inflammation: Secondary | ICD-10-CM

## 2019-09-08 DIAGNOSIS — I8311 Varicose veins of right lower extremity with inflammation: Secondary | ICD-10-CM

## 2019-09-08 NOTE — Progress Notes (Signed)
Varicose veins of bilateral  lower extremity with inflammation (454.1  I83.10)  At this time, the patient is happy with the results of her previous sclero sessions and does not wish to have any additional sclero.  On examination, her lower extremity skin is healthy with no varicosities present. No edema. Agree with patient decision to stop sclerotherapy treatment.  Patient to follow up PRN.

## 2019-09-10 ENCOUNTER — Ambulatory Visit
Admission: RE | Admit: 2019-09-10 | Discharge: 2019-09-10 | Disposition: A | Discharge status: Home / Self Care | Payer: 59 | Source: Ambulatory Visit | Attending: Internal Medicine | Admitting: Internal Medicine

## 2019-09-10 DIAGNOSIS — Z1231 Encounter for screening mammogram for malignant neoplasm of breast: Secondary | ICD-10-CM | POA: Insufficient documentation

## 2019-10-12 ENCOUNTER — Encounter (INDEPENDENT_AMBULATORY_CARE_PROVIDER_SITE_OTHER): Payer: Self-pay | Admitting: Vascular Surgery

## 2020-05-16 ENCOUNTER — Encounter: Payer: Self-pay | Admitting: Dermatology

## 2020-05-16 ENCOUNTER — Ambulatory Visit (INDEPENDENT_AMBULATORY_CARE_PROVIDER_SITE_OTHER): Payer: 59 | Admitting: Dermatology

## 2020-05-16 ENCOUNTER — Other Ambulatory Visit: Payer: Self-pay

## 2020-05-16 DIAGNOSIS — L578 Other skin changes due to chronic exposure to nonionizing radiation: Secondary | ICD-10-CM

## 2020-05-16 DIAGNOSIS — L719 Rosacea, unspecified: Secondary | ICD-10-CM | POA: Diagnosis not present

## 2020-05-16 DIAGNOSIS — D485 Neoplasm of uncertain behavior of skin: Secondary | ICD-10-CM

## 2020-05-16 DIAGNOSIS — Z1283 Encounter for screening for malignant neoplasm of skin: Secondary | ICD-10-CM

## 2020-05-16 DIAGNOSIS — L219 Seborrheic dermatitis, unspecified: Secondary | ICD-10-CM

## 2020-05-16 DIAGNOSIS — D229 Melanocytic nevi, unspecified: Secondary | ICD-10-CM

## 2020-05-16 DIAGNOSIS — L821 Other seborrheic keratosis: Secondary | ICD-10-CM

## 2020-05-16 DIAGNOSIS — D1800 Hemangioma unspecified site: Secondary | ICD-10-CM

## 2020-05-16 DIAGNOSIS — L814 Other melanin hyperpigmentation: Secondary | ICD-10-CM

## 2020-05-16 MED ORDER — KETOCONAZOLE 2 % EX SHAM: MEDICATED_SHAMPOO | CUTANEOUS | 1 refills | Status: AC

## 2020-05-16 MED ORDER — CLOBETASOL PROPIONATE 0.05 % EX SOLN
CUTANEOUS | 1 refills | Status: DC
Start: 2020-05-16 — End: 2022-06-27

## 2020-05-16 NOTE — Progress Notes (Signed)
Follow-Up Visit   Subjective  Elizabeth Grant is a 45 y.o. female who presents for the following: Annual Exam (Total body exam today. ).  She has an itchy scalp and redness of the face.   The following portions of the chart were reviewed this encounter and updated as appropriate:  Tobacco  Allergies  Meds  Problems  Med Hx  Surg Hx  Fam Hx      Review of Systems: No other skin or systemic complaints except as noted in HPI or Assessment and Plan.   Objective  Well appearing patient in no apparent distress; mood and affect are within normal limits.  A full examination was performed including scalp, head, eyes, ears, nose, lips, neck, chest, axillae, abdomen, back, buttocks, bilateral upper extremities, bilateral lower extremities, hands, feet, fingers, toes, fingernails, and toenails. All findings within normal limits unless otherwise noted below.  Objective  Right Lateral Breast: 0.5 cm erythematous tan papule     Objective  Head - Anterior (Face): Mid face erythema with telangiectasias +/- scattered inflammatory papules.   Objective  Scalp: Pink patches with greasy scale.   Assessment & Plan  Neoplasm of uncertain behavior of skin Right Lateral Breast  Epidermal / dermal shaving  Lesion diameter (cm):  0.5 Informed consent: discussed and consent obtained   Timeout: patient name, date of birth, surgical site, and procedure verified   Anesthesia: the lesion was anesthetized in a standard fashion   Anesthetic:  1% lidocaine w/ epinephrine 1-100,000 local infiltration Instrument used: flexible razor blade   Hemostasis achieved with: aluminum chloride   Outcome: patient tolerated procedure well   Post-procedure details: wound care instructions given   Additional details:  Mupirocin and a bandage applied  Specimen 1 - Surgical pathology Differential Diagnosis: R/o irritated nevus vs other  Check Margins: Yes 0.5 cm erythematous tan papule  Rosacea Head -  Anterior (Face)  Rosacea is a chronic progressive skin condition usually affecting the face of adults, causing redness and/or acne bumps. It is treatable but not curable. It sometimes affects the eyes (ocular rosacea) as well. It may respond to topical and/or systemic medication and can flare with stress, sun exposure, alcohol, exercise and some foods.  Daily application of broad spectrum spf 30+ sunscreen to face is recommended to reduce flares.  Discussed prescription metronidazole and rhofade treatment options. Pt defers treatment today.   Pt denies any grittiness of the eyes.   Seborrheic dermatitis Scalp  Chronic condition with duration or expected duration over one year. No cure, only control. Condition is bothersome to patient. Currently flared.  Discussed treatment options.   Start ketoconazole shampoo. Apply three times per week, massage into scalp and leave in for 10 minutes before rinsing out  Start clobetasol solution. Apply to scalp as needed for itch. Avoid applying to face, groin, and axilla. Use as directed. Risk of skin atrophy with long-term use reviewed.   Topical steroids (such as triamcinolone, fluocinolone, fluocinonide, mometasone, clobetasol, halobetasol, betamethasone, hydrocortisone) can cause thinning and lightening of the skin if they are used for too long in the same area. Your physician has selected the right strength medicine for your problem and area affected on the body. Please use your medication only as directed by your physician to prevent side effects.      ketoconazole (NIZORAL) 2 % shampoo - Scalp  clobetasol (TEMOVATE) 0.05 % external solution - Scalp  Lentigines - Scattered tan macules - Due to sun exposure - Benign-appering, observe - Recommend daily  broad spectrum sunscreen SPF 30+ to sun-exposed areas, reapply every 2 hours as needed. - Call for any changes  Seborrheic Keratoses Left lateral breast - Stuck-on, waxy, tan-brown papules  and/or plaques  - Benign-appearing - Discussed benign etiology and prognosis. - Observe - Call for any changes  Melanocytic Nevi - Tan-brown and/or pink-flesh-colored symmetric macules and papules - Benign appearing on exam today - Observation - Call clinic for new or changing moles - Recommend daily use of broad spectrum spf 30+ sunscreen to sun-exposed areas.   Hemangiomas - Red papules - Discussed benign nature - Observe - Call for any changes  Actinic Damage - Chronic condition, secondary to cumulative UV/sun exposure - diffuse scaly erythematous macules with underlying dyspigmentation - Recommend daily broad spectrum sunscreen SPF 30+ to sun-exposed areas, reapply every 2 hours as needed.  - Staying in the shade or wearing long sleeves, sun glasses (UVA+UVB protection) and wide brim hats (4-inch brim around the entire circumference of the hat) are also recommended for sun protection.  - Call for new or changing lesions.  Skin cancer screening performed today.  Return in about 1 year (around 05/16/2021) for TBSE.   I, Harriett Sine, CMA, am acting as scribe for Forest Gleason, MD.  Documentation: I have reviewed the above documentation for accuracy and completeness, and I agree with the above.  Forest Gleason, MD

## 2020-05-16 NOTE — Patient Instructions (Addendum)
Recommend taking Heliocare sun protection supplement daily in sunny weather for additional sun protection. For maximum protection on the sunniest days, you can take up to 2 capsules of regular Heliocare OR take 1 capsule of Heliocare Ultra. For prolonged exposure (such as a full day in the sun), you can repeat your dose of the supplement 4 hours after your first dose. Heliocare can be purchased at [facility] or at [email or web address].    For temporary redness reduction Mix 1 bottle of Afrin original in 1 bottle of Cerave PM, shake well, and apply 1 hour before you want redness to be improved in the morning

## 2020-07-13 ENCOUNTER — Other Ambulatory Visit: Payer: Self-pay | Admitting: Internal Medicine

## 2020-07-13 DIAGNOSIS — Z1231 Encounter for screening mammogram for malignant neoplasm of breast: Secondary | ICD-10-CM

## 2020-09-11 ENCOUNTER — Other Ambulatory Visit: Payer: Self-pay

## 2020-09-11 ENCOUNTER — Ambulatory Visit
Admission: RE | Admit: 2020-09-11 | Discharge: 2020-09-11 | Disposition: A | Discharge status: Home / Self Care | Payer: BC Managed Care – PPO | Source: Ambulatory Visit | Attending: Internal Medicine | Admitting: Internal Medicine

## 2020-09-11 DIAGNOSIS — Z1231 Encounter for screening mammogram for malignant neoplasm of breast: Secondary | ICD-10-CM | POA: Diagnosis not present

## 2020-09-18 ENCOUNTER — Other Ambulatory Visit: Payer: Self-pay | Admitting: Internal Medicine

## 2020-09-18 DIAGNOSIS — R928 Other abnormal and inconclusive findings on diagnostic imaging of breast: Secondary | ICD-10-CM

## 2020-09-18 DIAGNOSIS — N6320 Unspecified lump in the left breast, unspecified quadrant: Secondary | ICD-10-CM

## 2020-09-25 ENCOUNTER — Other Ambulatory Visit: Payer: Self-pay

## 2020-09-25 ENCOUNTER — Ambulatory Visit
Admission: RE | Admit: 2020-09-25 | Discharge: 2020-09-25 | Disposition: A | Discharge status: Home / Self Care | Payer: BC Managed Care – PPO | Source: Ambulatory Visit | Attending: Internal Medicine | Admitting: Internal Medicine

## 2020-09-25 DIAGNOSIS — N6320 Unspecified lump in the left breast, unspecified quadrant: Secondary | ICD-10-CM | POA: Diagnosis present

## 2020-09-25 DIAGNOSIS — R928 Other abnormal and inconclusive findings on diagnostic imaging of breast: Secondary | ICD-10-CM | POA: Diagnosis not present

## 2021-04-10 ENCOUNTER — Ambulatory Visit: Payer: BC Managed Care – PPO | Admitting: Dermatology

## 2021-04-25 ENCOUNTER — Telehealth: Payer: Self-pay

## 2021-04-25 NOTE — Telephone Encounter (Signed)
LVM for pt to call to reschedule appt with Dr. Laurence Ferrari. ?Lurlean Horns., RMA ?

## 2021-05-01 ENCOUNTER — Encounter: Payer: BC Managed Care – PPO | Admitting: Dermatology

## 2021-05-16 ENCOUNTER — Ambulatory Visit: Payer: 59 | Admitting: Dermatology

## 2021-06-26 ENCOUNTER — Ambulatory Visit (INDEPENDENT_AMBULATORY_CARE_PROVIDER_SITE_OTHER): Payer: BC Managed Care – PPO | Admitting: Dermatology

## 2021-06-26 DIAGNOSIS — L811 Chloasma: Secondary | ICD-10-CM | POA: Diagnosis not present

## 2021-06-26 DIAGNOSIS — L814 Other melanin hyperpigmentation: Secondary | ICD-10-CM

## 2021-06-26 DIAGNOSIS — Z1283 Encounter for screening for malignant neoplasm of skin: Secondary | ICD-10-CM | POA: Diagnosis not present

## 2021-06-26 DIAGNOSIS — L858 Other specified epidermal thickening: Secondary | ICD-10-CM

## 2021-06-26 DIAGNOSIS — D171 Benign lipomatous neoplasm of skin and subcutaneous tissue of trunk: Secondary | ICD-10-CM | POA: Diagnosis not present

## 2021-06-26 DIAGNOSIS — D1730 Benign lipomatous neoplasm of skin and subcutaneous tissue of unspecified sites: Secondary | ICD-10-CM

## 2021-06-26 DIAGNOSIS — D229 Melanocytic nevi, unspecified: Secondary | ICD-10-CM

## 2021-06-26 DIAGNOSIS — L578 Other skin changes due to chronic exposure to nonionizing radiation: Secondary | ICD-10-CM | POA: Diagnosis not present

## 2021-06-26 DIAGNOSIS — L821 Other seborrheic keratosis: Secondary | ICD-10-CM

## 2021-06-26 DIAGNOSIS — D225 Melanocytic nevi of trunk: Secondary | ICD-10-CM

## 2021-06-26 DIAGNOSIS — L219 Seborrheic dermatitis, unspecified: Secondary | ICD-10-CM

## 2021-06-26 NOTE — Progress Notes (Signed)
? ?Follow-Up Visit ?  ?Subjective  ?Elizabeth Grant is a 46 y.o. female who presents for the following: Annual Exam. ? ?The patient presents for Total-Body Skin Exam (TBSE) for skin cancer screening and mole check.  The patient has spots, moles and lesions to be evaluated, some may be new or changing. Patient has ketoconazole shampoo and clobetasol solution for seborrheic dermatitis of the scalp, but hasn't had to use it recently. No itch.   ?The following portions of the chart were reviewed this encounter and updated as appropriate:  ?  ?  ? ?Review of Systems:  No other skin or systemic complaints except as noted in HPI or Assessment and Plan. ? ?Objective  ?Well appearing patient in no apparent distress; mood and affect are within normal limits. ? ?A full examination was performed including scalp, head, eyes, ears, nose, lips, neck, chest, axillae, abdomen, back, buttocks, bilateral upper extremities, bilateral lower extremities, hands, feet, fingers, toes, fingernails, and toenails. All findings within normal limits unless otherwise noted below. ? ?R upper flank at braline ?7.0 mm pink flesh papule ? ? ?L spinal lower back ?1.0 mm med dark brown macule ? ?Mid Forehead ?Reticulated hyperpigmented patches.  ? ?scalp ?Clear today. ? ? ? ?Assessment & Plan  ?Skin cancer screening performed today. ? ?Actinic Damage ?- chronic, secondary to cumulative UV radiation exposure/sun exposure over time ?- diffuse scaly erythematous macules with underlying dyspigmentation ?- Recommend daily broad spectrum sunscreen SPF 30+ to sun-exposed areas, reapply every 2 hours as needed.  ?- Recommend staying in the shade or wearing long sleeves, sun glasses (UVA+UVB protection) and wide brim hats (4-inch brim around the entire circumference of the hat). ?- Call for new or changing lesions. ? ?Keratosis Pilaris ?- Tiny follicular keratotic papules on the chest, upper arms, buttocks ?- Benign. Genetic in nature. No cure. ?- Observe. ?- If  desired, patient can use an emollient (moisturizer) containing ammonium lactate, urea or salicylic acid once a day to smooth the area ? ?Lentigines ?- Scattered tan macules ?- Due to sun exposure ?- Benign-appering, observe ?- Recommend daily broad spectrum sunscreen SPF 30+ to sun-exposed areas, reapply every 2 hours as needed. ?- Call for any changes ? ?Seborrheic Keratoses ?- Stuck-on, waxy, tan-brown papules and/or plaques, including left lateral breast  ?- Benign-appearing ?- Discussed benign etiology and prognosis. ?- Observe ?- Call for any changes ? 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 ?Discussed punch excision if becomes bothersome. Pt defers today.  ? ?Nevus ?L spinal lower back ? ?Benign-appearing.  Observation.  Call clinic for new or changing moles.  Recommend daily use of broad spectrum spf 30+ sunscreen to sun-exposed areas.  ? ?Melasma ?Mid Forehead ? ?Chronic and persistent condition with duration or expected duration over one year. Not currently at goal.  Pt defers Rx today.  Will continue sunscreen and photoprotection. ? ?Melasma is a chronic; persistent condition of hyperpigmented patches generally on the face, worse in summer due to higher UV exposure.    ?Heredity; thyroid disease; sun exposure; pregnancy; birth control pills; epilepsy medication and darker skin may predispose to Melasma.   ?Recommendations include: ?- Sun avoidance and daily broad spectrum (UVA/UVB) sunscreen SPF 30+, preferably with Zinc or Titanium Dioxide. ?- Rx topical bleaching creams (i.e. hydroquinone) is a common treatment but should not be used long term.  Hydroquinones may be mixed with retinoids; steroids; Kojic Acid. ?- Rx Azelaic Acid is also a treatment option that  is safe for pregnancy (Category B). ?- OTC Heliocare can be helpful in control and prevention. ?- Oral Rx with Tranexamic Acid 250 mg - 650 mg po daily can be used for moderate to  severe cases especially during summer (contraindications include pregnancy; lactation; hx of PE; DVT; clotting disorder; heart disease; anticoagulant use and upcoming long trips)   ?- Chemical peels (would need multiple for best result).  ?- Lasers and  Microdermabrasion may also be helpful adjunct treatments. ? ?Seborrheic dermatitis ?scalp ? ?Chronic condition with duration or expected duration over one year. Currently well-controlled.  ? ?Seborrheic Dermatitis  ?-  is a chronic persistent rash characterized by pinkness and scaling most commonly of the mid face but also can occur on the scalp (dandruff), ears; mid chest, mid back and groin.  It tends to be exacerbated by stress and cooler weather.  People who have neurologic disease may experience new onset or exacerbation of existing seborrheic dermatitis.  The condition is not curable but treatable and can be controlled. ? ?Continue ketoconazole 2% shampoo prn, massage into scalp and let sit several mins before rinsing out. ? ?Continue clobetasol solution qd/bid prn itch.  ? ?Pt has Rxs at home. Will call if refills needed.  ? ? ?Related Medications ?ketoconazole (NIZORAL) 2 % shampoo ?Apply three times per week, massage into scalp and leave in for 10 minutes before rinsing out. ? ?clobetasol (TEMOVATE) 0.05 % external solution ?Apply to scalp as needed for itch. ? ?Melanocytic Nevi ?- Tan-brown and/or pink-flesh-colored symmetric macules and papules; flesh papule L gluteal cleft ?- Benign appearing on exam today ?- Observation ?- Call clinic for new or changing moles ?- Recommend daily use of broad spectrum spf 30+ sunscreen to sun-exposed areas.  ? ?Return in about 1 year (around 06/27/2022) for TBSE. ? ?I, Jamesetta Orleans, CMA, am acting as scribe for Brendolyn Patty, MD . ?Documentation: I have reviewed the above documentation for accuracy and completeness, and I agree with the above. ? ?Brendolyn Patty MD  ? ?

## 2021-06-26 NOTE — Patient Instructions (Addendum)
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 Genetic in nature. No cure. ?- Observe. ?- If desired, patient can use an emollient (moisturizer) containing ammonium lactate, urea or salicylic acid once a day to smooth the area ? ?Recommend starting moisturizer with exfoliant (Urea, Salicylic acid, or Lactic acid) one to two times daily to help smooth rough and bumpy skin.  OTC options include Cetaphil Rough and Bumpy lotion (Urea), Eucerin Roughness Relief lotion or spot treatment cream (Urea), CeraVe SA lotion/cream for Rough and Bumpy skin (Sal Acid), Gold Bond Rough and Bumpy cream (Sal Acid), and AmLactin 12% lotion/cream (Lactic Acid).  If applying in morning, also apply sunscreen to sun-exposed areas, since these exfoliating moisturizers can increase sensitivity to sun. ? ? ?Melasma is a chronic; persistent condition of hyperpigmented patches generally on the face, worse in summer due to higher UV exposure.    ?Heredity; thyroid disease; sun exposure; pregnancy; birth control pills; epilepsy medication and darker skin may predispose to Melasma.   ?Recommendations include: ?- Sun avoidance and daily broad spectrum (UVA/UVB) sunscreen SPF 30+, preferably with Zinc or Titanium Dioxide. ?- Rx topical bleaching creams (i.e. hydroquinone) is a common treatment but should not be used long term.  Hydroquinones may be mixed with retinoids; steroids; Kojic Acid. ?- Rx Azelaic Acid is also a treatment option that is safe for pregnancy (Category B). ?- OTC Heliocare can be helpful in control and prevention. ?- Oral Rx with Tranexamic Acid 250 mg - 650 mg po daily can be used for moderate to severe cases especially during summer (contraindications include pregnancy; lactation; hx of PE; DVT; clotting disorder; heart disease; anticoagulant use and upcoming long trips)   ?- Chemical peels (would need multiple for best result).  ?- Lasers and  Microdermabrasion may also be helpful adjunct treatments. ? ?Melanoma  ABCDEs ? ?Melanoma is the most dangerous type of skin cancer, and is the leading cause of death from skin disease.  You are more likely to develop melanoma if you: ?Have light-colored skin, light-colored eyes, or red or blond hair ?Spend a lot of time in the sun ?Tan regularly, either outdoors or in a tanning bed ?Have had blistering sunburns, especially during childhood ?Have a close family member who has had a melanoma ?Have atypical moles or large birthmarks ? ?Early detection of melanoma is key since treatment is typically straightforward and cure rates are extremely high if we catch it early.  ? ?The first sign of melanoma is often a change in a mole or a new dark spot.  The ABCDE system is a way of remembering the signs of melanoma. ? ?A for asymmetry:  The two halves do not match. ?B for border:  The edges of the growth are irregular. ?C for color:  A mixture of colors are present instead of an even brown color. ?D for diameter:  Melanomas are usually (but not always) greater than 71m - the size of a pencil eraser. ?E for evolution:  The spot keeps changing in size, shape, and color. ? ?Please check your skin once per month between visits. You can use a small mirror in front and a large mirror behind you to keep an eye on the back side or your body.  ? ?If you see any new or changing lesions before your next follow-up, please call to schedule a visit. ? ?Please continue daily skin protection including broad spectrum sunscreen SPF 30+ to sun-exposed areas, reapplying every 2 hours as needed when you're outdoors.  ? ?  Staying in the shade or wearing long sleeves, sun glasses (UVA+UVB protection) and wide brim hats (4-inch brim around the entire circumference of the hat) are also recommended for sun protection.   ?If You Need Anything After Your Visit ? ?If you have any questions or concerns for your doctor, please call our main line at [phone] and press option 4 to reach your doctor's medical assistant. If  no one answers, please leave a voicemail as directed and we will return your call as soon as possible. Messages left after 4 pm will be answered the following business day.  ? ?You may also send [place] a message via MyChart. We typically respond to MyChart messages within 1-2 business days. ? ?For prescription refills, please ask your pharmacy to contact our office. Our fax number is [phone]. ? ?If you have an urgent issue when the clinic is closed that cannot wait until the next business day, you can page your doctor at the number below.   ? ?Please note that while we do our best to be available for urgent issues outside of office hours, we are not available 24/7.  ? ?If you have an urgent issue and are unable to reach [place], you may choose to seek medical care at your doctor's office, retail clinic, urgent care center, or emergency room. ? ?If you have a medical emergency, please immediately call 911 or go to the emergency department. ? ?Pager Numbers ? ?- Dr. Nehemiah Massed: [phone] ? ?- Dr. Laurence Ferrari: [phone] ? ?- Dr. Nicole Kindred: [phone] ? ?In the event of inclement weather, please call our main line at [phone] for an update on the status of any delays or closures. ? ?Dermatology Medication Tips: ?Please keep the boxes that topical medications come in in order to help keep track of the instructions about where and how to use these. Pharmacies typically print the medication instructions only on the boxes and not directly on the medication tubes.  ? ?If your medication is too expensive, please contact our office at [phone] option 4 or send [place] a message through [place].  ? ?We are unable to tell what your co-pay for medications will be in advance as this is different depending on your insurance coverage. However, we may be able to find a substitute medication at lower cost or fill out paperwork to get insurance to cover a needed medication.  ? ?If a prior authorization is required to get your medication  covered by your insurance company, please allow [place] 1-2 business days to complete this process. ? ?Drug prices often vary depending on where the prescription is filled and some pharmacies may offer cheaper prices. ? ?The website www.goodrx.com contains coupons for medications through different pharmacies. The prices here do not account for what the cost may be with help from insurance (it may be cheaper with your insurance), but the website can give you the price if you did not use any insurance.  ?- You can print the associated coupon and take it with your prescription to the pharmacy.  ?- You may also stop by our office during regular business hours and pick up a GoodRx coupon card.  ?- If you need your prescription sent electronically to a different pharmacy, notify our office through [facility] or by phone at [phone] option 4. ? ? ? ? ?Si Usted Necesita Algo Despu?s de Su Visita ? ?Tambi?n puede enviarnos un mensaje a trav?s de MyChart. Por lo general respondemos a los mensajes de Therapist, occupational transcurso  de 1 a 2 d?as h?biles. ? ?Para renovar recetas, por favor pida a su farmacia que se ponga en contacto con nuestra oficina. Nuestro n?mero de fax es el [phone]. ? ?Si tiene un asunto urgente cuando la cl?nica est? cerrada y que no puede esperar hasta el siguiente d?a h?bil, puede llamar/localizar a su doctor(a) al n?mero que aparece a continuaci?n.  ? ?Por favor, tenga en cuenta que aunque hacemos todo lo posible para estar disponibles para asuntos urgentes fuera del horario de oficina, no estamos disponibles las 24 horas del d?a, los 7 d?as de la semana.  ? ?Si tiene un problema urgente y no puede comunicarse con nosotros, puede optar por buscar atenci?n m?dica  en el consultorio de su doctor(a), en una cl?nica privada, en un centro de atenci?n urgente o en una sala de emergencias. ? ?Si tiene Engineer, maintenance (IT) m?dica, por favor llame inmediatamente al 911 o vaya a la sala de  emergencias. ? ?N?meros de b?per ? ?- Dr. Nehemiah Massed: [phone] ? ?- Dra. Moye: [phone] ? ?- Dra. Nicole Kindred: [phone] ? ?En caso de inclemencias del tiempo, por favor llame a nuestra l?nea principal al [phone] pa

## 2021-08-14 ENCOUNTER — Other Ambulatory Visit: Payer: Self-pay | Admitting: Internal Medicine

## 2021-08-14 DIAGNOSIS — Z1231 Encounter for screening mammogram for malignant neoplasm of breast: Secondary | ICD-10-CM

## 2021-09-12 ENCOUNTER — Ambulatory Visit
Admission: RE | Admit: 2021-09-12 | Discharge: 2021-09-12 | Disposition: A | Discharge status: Home / Self Care | Payer: BC Managed Care – PPO | Source: Ambulatory Visit | Attending: Internal Medicine | Admitting: Internal Medicine

## 2021-09-12 DIAGNOSIS — Z1231 Encounter for screening mammogram for malignant neoplasm of breast: Secondary | ICD-10-CM | POA: Insufficient documentation

## 2021-10-16 ENCOUNTER — Other Ambulatory Visit: Payer: Self-pay | Admitting: Obstetrics and Gynecology

## 2021-10-16 DIAGNOSIS — N63 Unspecified lump in unspecified breast: Secondary | ICD-10-CM

## 2021-11-05 ENCOUNTER — Ambulatory Visit
Admission: RE | Admit: 2021-11-05 | Discharge: 2021-11-05 | Disposition: A | Discharge status: Home / Self Care | Payer: BC Managed Care – PPO | Source: Ambulatory Visit | Attending: Obstetrics and Gynecology | Admitting: Obstetrics and Gynecology

## 2021-11-05 DIAGNOSIS — N63 Unspecified lump in unspecified breast: Secondary | ICD-10-CM

## 2021-12-17 ENCOUNTER — Encounter (INDEPENDENT_AMBULATORY_CARE_PROVIDER_SITE_OTHER): Payer: Self-pay

## 2022-02-02 IMAGING — MG MM DIGITAL SCREENING BILAT W/ TOMO AND CAD
8 series · 8 of 24 positions shown · non-contrast
Comparison: Previous exam(s).

CLINICAL DATA: Screening.

EXAM:
DIGITAL SCREENING BILATERAL MAMMOGRAM WITH TOMOSYNTHESIS AND CAD
TECHNIQUE: Bilateral screening digital craniocaudal and mediolateral oblique
mammograms were obtained. Bilateral screening digital breast
tomosynthesis was performed. The images were evaluated with
computer-aided detection.

[L CC synth-2D]
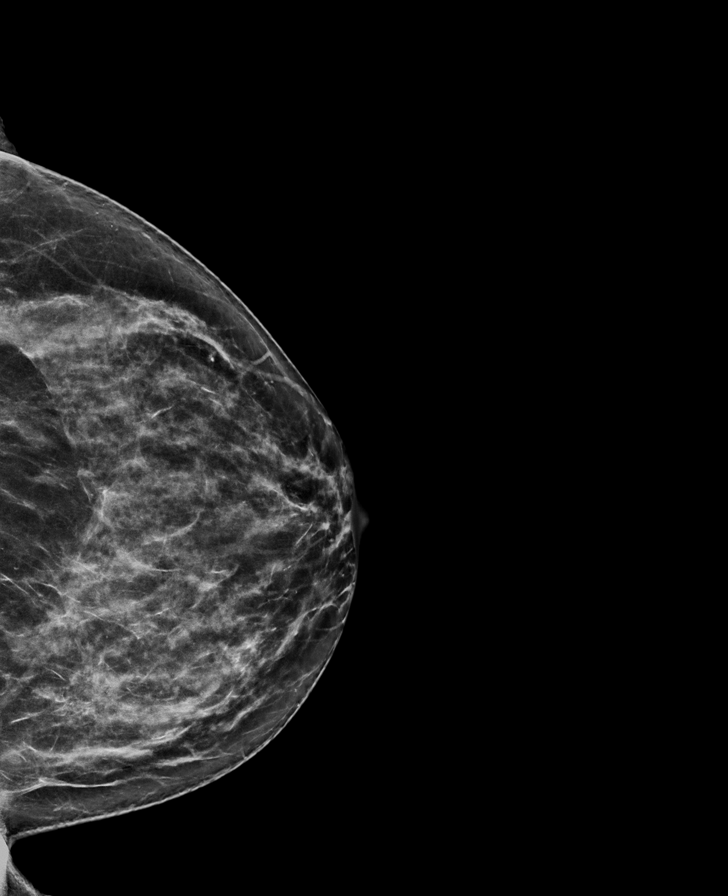

[R CC synth-2D]
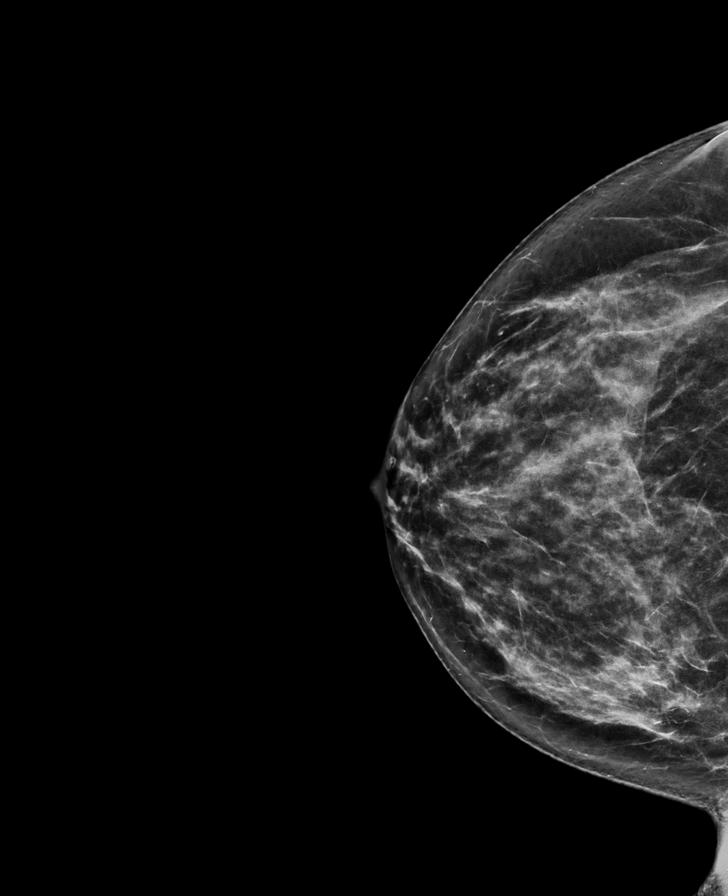

[L MLO synth-2D]
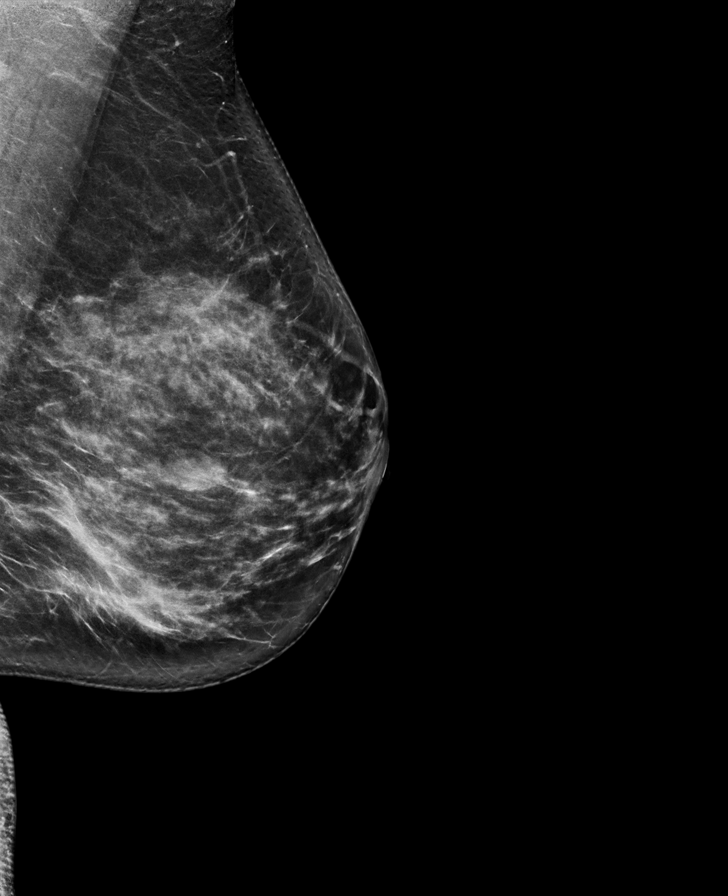

[R MLO synth-2D]
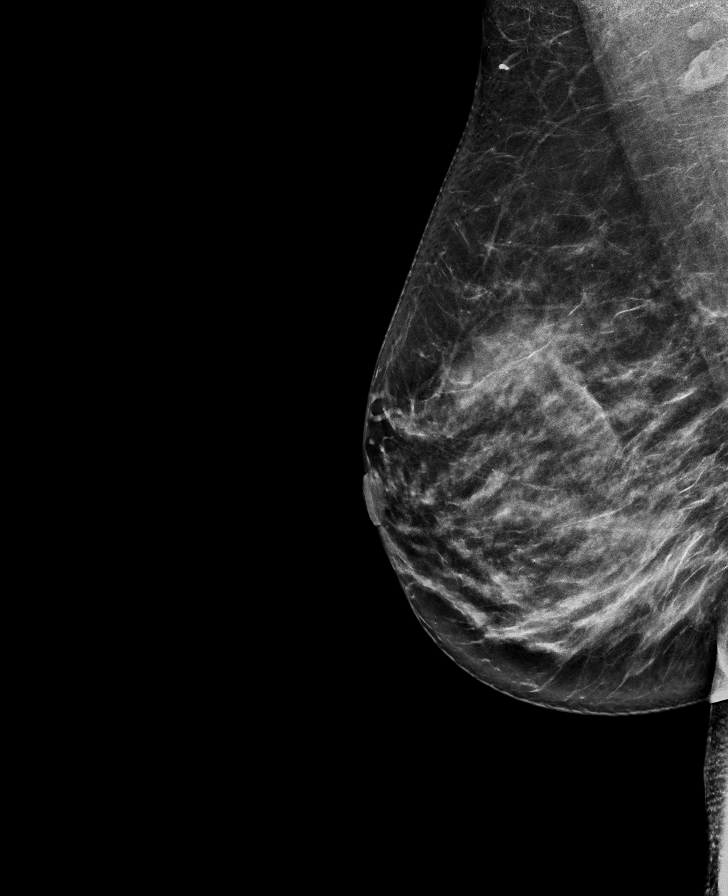

[R MLO tomo · tomo slice 39/78.0]
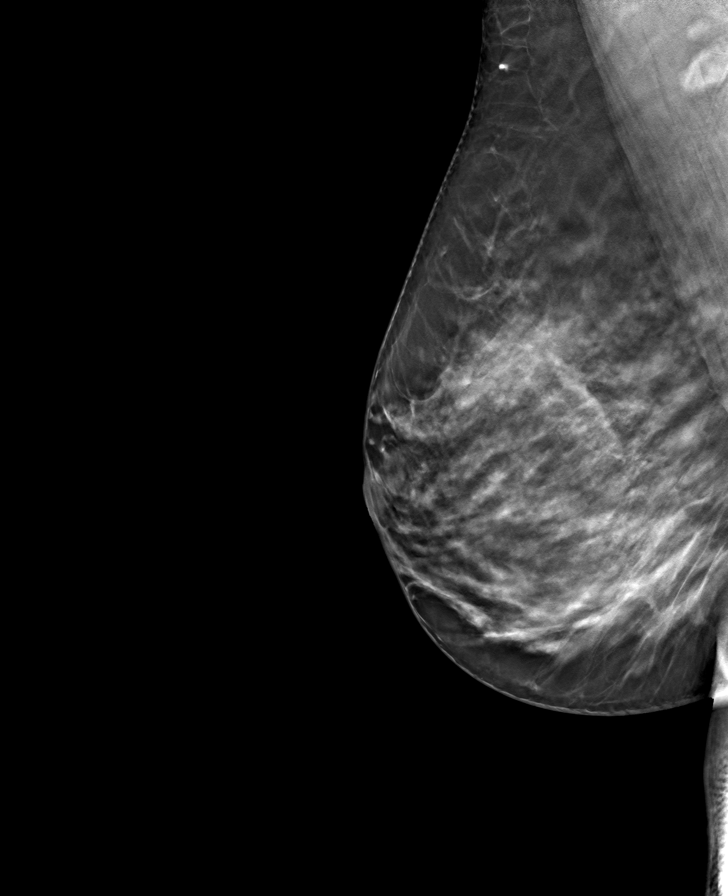

[L MLO tomo · tomo slice 41/81.0]
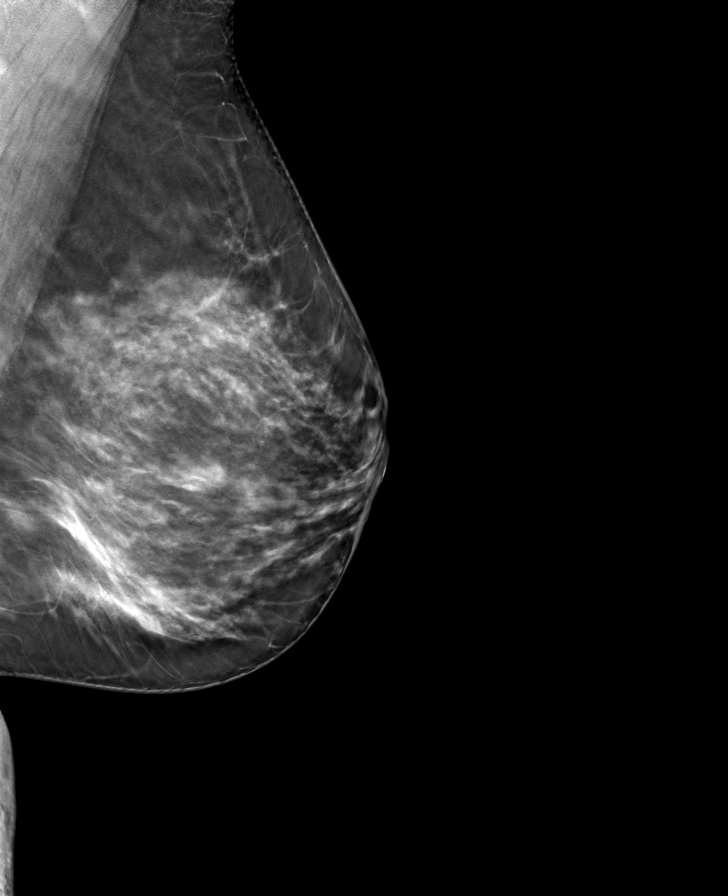

[R CC tomo · tomo slice 36/71.0]
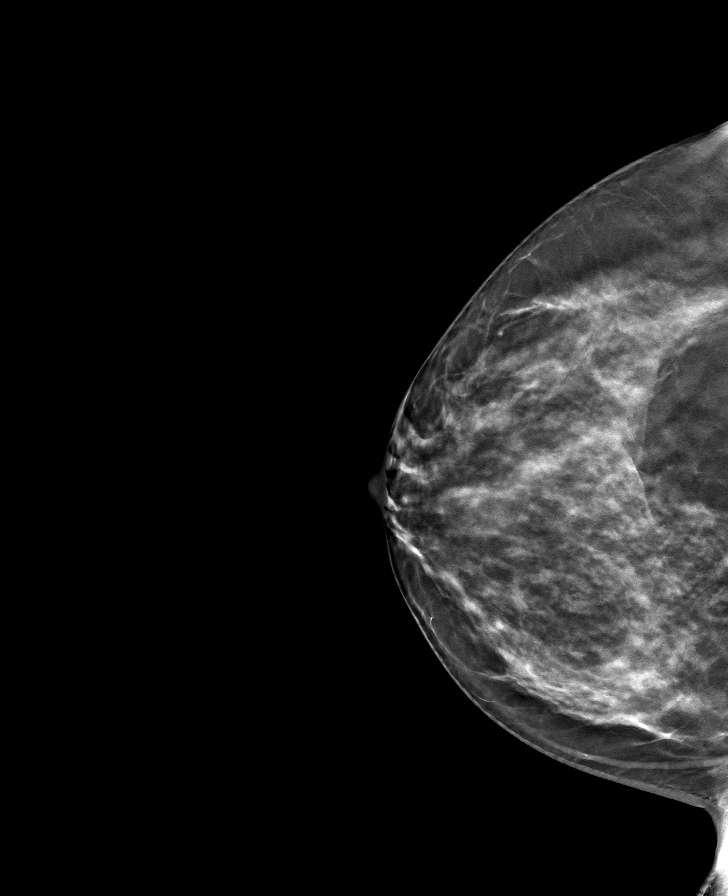

[L CC tomo · tomo slice 37/73.0]
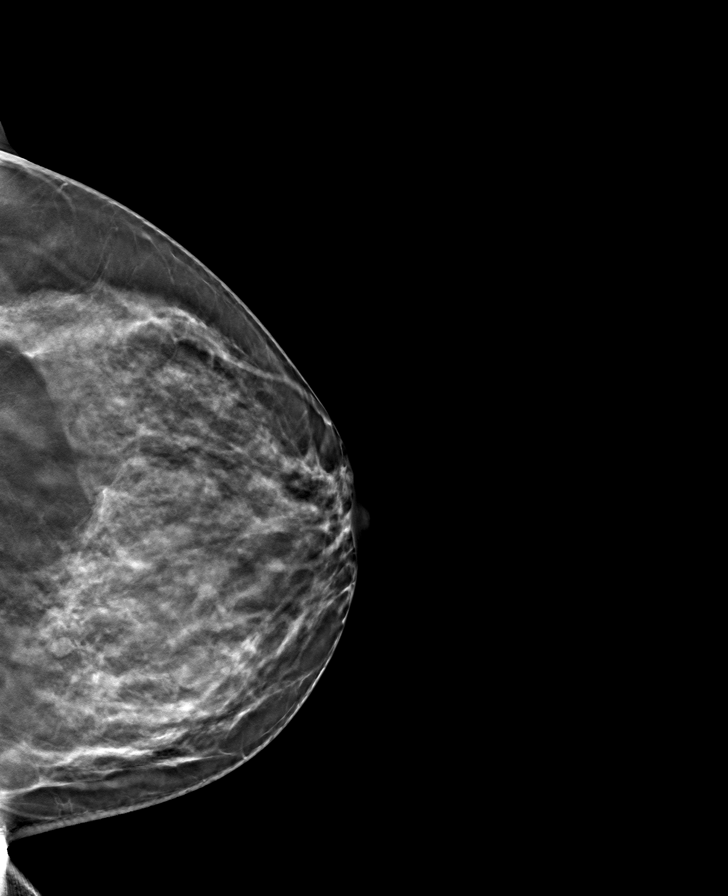

[8 of 24 positions shown; findings below may reference images not displayed]

ACR Breast Density Category c: The breast tissue is heterogeneously
dense, which may obscure small masses.
FINDINGS: In the left breast, a possible mass warrants further evaluation. In
the right breast, no findings suspicious for malignancy.
IMPRESSION: Further evaluation is suggested for a possible mass in the left
breast.

RECOMMENDATION:
Diagnostic mammogram and possibly ultrasound of the left breast.
(Code:C3-P-XXB)

The patient will be contacted regarding the findings, and additional
imaging will be scheduled.

BI-RADS CATEGORY  0: Incomplete. Need additional imaging evaluation
and/or prior mammograms for comparison.

## 2022-06-27 ENCOUNTER — Ambulatory Visit (INDEPENDENT_AMBULATORY_CARE_PROVIDER_SITE_OTHER): Payer: BC Managed Care – PPO | Admitting: Dermatology

## 2022-06-27 DIAGNOSIS — L309 Dermatitis, unspecified: Secondary | ICD-10-CM

## 2022-06-27 DIAGNOSIS — Z1283 Encounter for screening for malignant neoplasm of skin: Secondary | ICD-10-CM

## 2022-06-27 DIAGNOSIS — L821 Other seborrheic keratosis: Secondary | ICD-10-CM | POA: Diagnosis not present

## 2022-06-27 DIAGNOSIS — W908XXA Exposure to other nonionizing radiation, initial encounter: Secondary | ICD-10-CM

## 2022-06-27 DIAGNOSIS — L858 Other specified epidermal thickening: Secondary | ICD-10-CM

## 2022-06-27 DIAGNOSIS — X32XXXA Exposure to sunlight, initial encounter: Secondary | ICD-10-CM

## 2022-06-27 DIAGNOSIS — D229 Melanocytic nevi, unspecified: Secondary | ICD-10-CM

## 2022-06-27 DIAGNOSIS — L219 Seborrheic dermatitis, unspecified: Secondary | ICD-10-CM

## 2022-06-27 DIAGNOSIS — D1801 Hemangioma of skin and subcutaneous tissue: Secondary | ICD-10-CM

## 2022-06-27 DIAGNOSIS — L720 Epidermal cyst: Secondary | ICD-10-CM

## 2022-06-27 DIAGNOSIS — L814 Other melanin hyperpigmentation: Secondary | ICD-10-CM

## 2022-06-27 DIAGNOSIS — L578 Other skin changes due to chronic exposure to nonionizing radiation: Secondary | ICD-10-CM

## 2022-06-27 MED ORDER — CLOBETASOL PROPIONATE 0.05 % EX SOLN
CUTANEOUS | 1 refills | Status: AC
Start: 2022-06-27 — End: ?

## 2022-06-27 NOTE — Progress Notes (Signed)
Follow-Up Visit   Subjective  Elizabeth Grant is a 47 y.o. female who presents for the following: Skin Cancer Screening and Full Body Skin Exam  The patient presents for Total-Body Skin Exam (TBSE) for skin cancer screening and mole check. The patient has spots, moles and lesions to be evaluated, some may be new or changing and the patient has concerns that these could be cancer.  Patient with hx of seb derm. Rx's sent in for ketoconazole shampoo and clobetasol solution but patient never picked them up. She is having itching at scalp.   Patient advises she has been getting painful cysts at inner thighs.  The following portions of the chart were reviewed this encounter and updated as appropriate: medications, allergies, medical history  Review of Systems:  No other skin or systemic complaints except as noted in HPI or Assessment and Plan.  Objective  Well appearing patient in no apparent distress; mood and affect are within normal limits.  A full examination was performed including scalp, head, eyes, ears, nose, lips, neck, chest, axillae, abdomen, back, buttocks, bilateral upper extremities, bilateral lower extremities, hands, feet, fingers, toes, fingernails, and toenails. All findings within normal limits unless otherwise noted below.   Relevant physical exam findings are noted in the Assessment and Plan.    Assessment & Plan   LENTIGINES, SEBORRHEIC KERATOSES, HEMANGIOMAS - Benign normal skin lesions - Benign-appearing - Call for any changes  MELANOCYTIC NEVI - Tan-brown and/or pink-flesh-colored symmetric macules and papules - Benign appearing on exam today - Observation - Call clinic for new or changing moles - Recommend daily use of broad spectrum spf 30+ sunscreen to sun-exposed areas.   ACTINIC DAMAGE - Chronic condition, secondary to cumulative UV/sun exposure - diffuse scaly erythematous macules with underlying dyspigmentation - Recommend daily broad spectrum  sunscreen SPF 30+ to sun-exposed areas, reapply every 2 hours as needed.  - Staying in the shade or wearing long sleeves, sun glasses (UVA+UVB protection) and wide brim hats (4-inch brim around the entire circumference of the hat) are also recommended for sun protection.  - Call for new or changing lesions.  SKIN CANCER SCREENING PERFORMED TODAY.  SEBORRHEIC DERMATITIS VS OTHER (CONTACT DERMATITIS, ECZEMA) Exam: Pink patches with scale at scalp  Chronic and persistent condition with duration or expected duration over one year. Condition is bothersome/symptomatic for patient. Currently flared.  Seborrheic Dermatitis is a chronic persistent rash characterized by pinkness and scaling most commonly of the mid face but also can occur on the scalp (dandruff), ears; mid chest, mid back and groin.  It tends to be exacerbated by stress and cooler weather.  People who have neurologic disease may experience new onset or exacerbation of existing seborrheic dermatitis.  The condition is not curable but treatable and can be controlled.  Treatment Plan: Recommend color safe dandruff shampoo. Start clobetasol solution 1-2 times daily as needed for itch. Avoid applying to face, groin, and axilla. Use as directed. Long-term use can cause thinning of the skin.  Monitor for worsening itch after hair coloring.   Topical steroids (such as triamcinolone, fluocinolone, fluocinonide, mometasone, clobetasol, halobetasol, betamethasone, hydrocortisone) can cause thinning and lightening of the skin if they are used for too long in the same area. Your physician has selected the right strength medicine for your problem and area affected on the body. Please use your medication only as directed by your physician to prevent side effects.   Discussed patch testing.   KERATOSIS PILARIS - Tiny follicular keratotic papules -  Benign. Genetic in nature. No cure. - Observe. - If desired, patient can use an emollient  (moisturizer) containing ammonium lactate (AmLactin), urea or salicylic acid once a day to smooth the area  Recommend starting moisturizer with exfoliant (Urea, Salicylic acid, or Lactic acid) one to two times daily to help smooth rough and bumpy skin.  OTC options include Cetaphil Rough and Bumpy lotion (Urea), Eucerin Roughness Relief lotion or spot treatment cream (Urea), CeraVe SA lotion/cream for Rough and Bumpy skin (Sal Acid), Gold Bond Rough and Bumpy cream (Sal Acid), and AmLactin 12% lotion/cream (Lactic Acid).  If applying in morning, also apply sunscreen to sun-exposed areas, since these exfoliating moisturizers can increase sensitivity to sun.   EPIDERMAL INCLUSION CYST Exam: Subcutaneous nodule 1.3 cm slightly tender at left medial thigh  Cyst with symptoms and/or recent change.  Discussed surgical excision to remove, including resulting scar and possible recurrence.  Patient will schedule for surgery. Pre-op information given.     Return in about 1 year (around 06/27/2023) for TBSE, cyst excision with Dr. Roseanne Reno.  Anise Salvo, RMA, am acting as scribe for Darden Dates, MD .   Documentation: I have reviewed the above documentation for accuracy and completeness, and I agree with the above.  Darden Dates, MD

## 2022-06-27 NOTE — Patient Instructions (Addendum)
Recommend color safe dandruff shampoo.   Start clobetasol solution 1-2 times daily as needed for itch. Avoid applying to face, groin, and axilla. Use as directed. Long-term use can cause thinning of the skin.  Topical steroids (such as triamcinolone, fluocinolone, fluocinonide, mometasone, clobetasol, halobetasol, betamethasone, hydrocortisone) can cause thinning and lightening of the skin if they are used for too long in the same area. Your physician has selected the right strength medicine for your problem and area affected on the body. Please use your medication only as directed by your physician to prevent side effects.   Recommend taking Heliocare sun protection supplement daily in sunny weather for additional sun protection. For maximum protection on the sunniest days, you can take up to 2 capsules of regular Heliocare OR take 1 capsule of Heliocare Ultra. For prolonged exposure (such as a full day in the sun), you can repeat your dose of the supplement 4 hours after your first dose. Heliocare can be purchased at [employer], at some [facility] or at [email or web address].    Melanoma ABCDEs  Melanoma is the most dangerous type of skin cancer, and is the leading cause of death from skin disease.  You are more likely to develop melanoma if you: Have light-colored skin, light-colored eyes, or red or blond hair Spend a lot of time in the sun Tan regularly, either outdoors or in a tanning bed Have had blistering sunburns, especially during childhood Have a close family member who has had a melanoma Have atypical moles or large birthmarks  Early detection of melanoma is key since treatment is typically straightforward and cure rates are extremely high if we catch it early.   The first sign of melanoma is often a change in a mole or a new dark spot.  The ABCDE system is a way of remembering the signs of melanoma.  A for asymmetry:  The two halves do not match. B for border:  The edges of  the growth are irregular. C for color:  A mixture of colors are present instead of an even brown color. D for diameter:  Melanomas are usually (but not always) greater than 6mm - the size of a pencil eraser. E for evolution:  The spot keeps changing in size, shape, and color.  Please check your skin once per month between visits. You can use a small mirror in front and a large mirror behind you to keep an eye on the back side or your body.   If you see any new or changing lesions before your next follow-up, please call to schedule a visit.  Please continue daily skin protection including broad spectrum sunscreen SPF 30+ to sun-exposed areas, reapplying every 2 hours as needed when you're outdoors.    Due to recent changes in healthcare laws, you may see results of your pathology and/or laboratory studies on MyChart before the doctors have had a chance to review them. We understand that in some cases there may be results that are confusing or concerning to you. Please understand that not all results are received at the same time and often the doctors may need to interpret multiple results in order to provide you with the best plan of care or course of treatment. Therefore, we ask that you please give [place] 2 business days to thoroughly review all your results before contacting the office for clarification. Should we see a critical lab result, you will be contacted sooner.   If You Need Anything After Your Visit  If  you have any questions or concerns for your doctor, please call our main line at [phone] and press option 4 to reach your doctor's medical assistant. If no one answers, please leave a voicemail as directed and we will return your call as soon as possible. Messages left after 4 pm will be answered the following business day.   You may also send [place] a message via MyChart. We typically respond to MyChart messages within 1-2 business days.  For prescription refills, please ask your  pharmacy to contact our office. Our fax number is [phone].  If you have an urgent issue when the clinic is closed that cannot wait until the next business day, you can page your doctor at the number below.    Please note that while we do our best to be available for urgent issues outside of office hours, we are not available 24/7.   If you have an urgent issue and are unable to reach [place], you may choose to seek medical care at your doctor's office, retail clinic, urgent care center, or emergency room.  If you have a medical emergency, please immediately call 911 or go to the emergency department.  Pager Numbers  - Dr. Gwen Pounds: [phone]  - Dr. Neale Burly: [phone]  - Dr. Roseanne Reno: [phone]  In the event of inclement weather, please call our main line at [phone] for an update on the status of any delays or closures.  Dermatology Medication Tips: Please keep the boxes that topical medications come in in order to help keep track of the instructions about where and how to use these. Pharmacies typically print the medication instructions only on the boxes and not directly on the medication tubes.   If your medication is too expensive, please contact our office at [phone] option 4 or send [place] a message through MyChart.   We are unable to tell what your co-pay for medications will be in advance as this is different depending on your insurance coverage. However, we may be able to find a substitute medication at lower cost or fill out paperwork to get insurance to cover a needed medication.   If a prior authorization is required to get your medication covered by your insurance company, please allow [place] 1-2 business days to complete this process.  Drug prices often vary depending on where the prescription is filled and some pharmacies may offer cheaper prices.  The website www.goodrx.com contains coupons for medications through different pharmacies. The prices here do not  account for what the cost may be with help from insurance (it may be cheaper with your insurance), but the website can give you the price if you did not use any insurance.  - You can print the associated coupon and take it with your prescription to the pharmacy.  - You may also stop by our office during regular business hours and pick up a GoodRx coupon card.  - If you need your prescription sent electronically to a different pharmacy, notify our office through [facility] or by phone at [phone] option 4.

## 2022-07-22 ENCOUNTER — Encounter: Payer: BC Managed Care – PPO | Admitting: Dermatology

## 2022-07-23 ENCOUNTER — Other Ambulatory Visit: Payer: Self-pay | Admitting: Internal Medicine

## 2022-07-23 DIAGNOSIS — Z1231 Encounter for screening mammogram for malignant neoplasm of breast: Secondary | ICD-10-CM

## 2022-09-16 ENCOUNTER — Ambulatory Visit
Admission: RE | Admit: 2022-09-16 | Discharge: 2022-09-16 | Disposition: A | Discharge status: Home / Self Care | Payer: BC Managed Care – PPO | Source: Ambulatory Visit | Attending: Internal Medicine | Admitting: Internal Medicine

## 2022-09-16 DIAGNOSIS — Z1231 Encounter for screening mammogram for malignant neoplasm of breast: Secondary | ICD-10-CM | POA: Insufficient documentation

## 2022-09-27 ENCOUNTER — Other Ambulatory Visit: Payer: Self-pay | Admitting: Internal Medicine

## 2022-09-27 DIAGNOSIS — R928 Other abnormal and inconclusive findings on diagnostic imaging of breast: Secondary | ICD-10-CM

## 2022-10-03 ENCOUNTER — Ambulatory Visit
Admission: RE | Admit: 2022-10-03 | Discharge: 2022-10-03 | Disposition: A | Discharge status: Home / Self Care | Payer: BC Managed Care – PPO | Source: Ambulatory Visit | Attending: Internal Medicine | Admitting: Internal Medicine

## 2022-10-03 DIAGNOSIS — R928 Other abnormal and inconclusive findings on diagnostic imaging of breast: Secondary | ICD-10-CM | POA: Insufficient documentation

## 2023-05-20 ENCOUNTER — Ambulatory Visit: Admitting: Dermatology

## 2023-06-24 ENCOUNTER — Ambulatory Visit: Payer: BC Managed Care – PPO | Admitting: Dermatology

## 2023-06-24 DIAGNOSIS — L219 Seborrheic dermatitis, unspecified: Secondary | ICD-10-CM

## 2023-06-24 DIAGNOSIS — L910 Hypertrophic scar: Secondary | ICD-10-CM

## 2023-06-24 DIAGNOSIS — W908XXA Exposure to other nonionizing radiation, initial encounter: Secondary | ICD-10-CM

## 2023-06-24 DIAGNOSIS — L814 Other melanin hyperpigmentation: Secondary | ICD-10-CM

## 2023-06-24 DIAGNOSIS — L299 Pruritus, unspecified: Secondary | ICD-10-CM

## 2023-06-24 DIAGNOSIS — L578 Other skin changes due to chronic exposure to nonionizing radiation: Secondary | ICD-10-CM | POA: Diagnosis not present

## 2023-06-24 DIAGNOSIS — L821 Other seborrheic keratosis: Secondary | ICD-10-CM

## 2023-06-24 DIAGNOSIS — L858 Other specified epidermal thickening: Secondary | ICD-10-CM

## 2023-06-24 DIAGNOSIS — D1801 Hemangioma of skin and subcutaneous tissue: Secondary | ICD-10-CM

## 2023-06-24 DIAGNOSIS — D229 Melanocytic nevi, unspecified: Secondary | ICD-10-CM

## 2023-06-24 DIAGNOSIS — Z1283 Encounter for screening for malignant neoplasm of skin: Secondary | ICD-10-CM

## 2023-06-24 MED ORDER — TRIAMCINOLONE ACETONIDE 40 MG/ML IJ SUSP
40.0000 mg | Freq: Once | INTRAMUSCULAR | Status: AC
Start: 2023-06-24 — End: 2023-06-24
  Administered 2023-06-24: 40 mg

## 2023-06-24 NOTE — Progress Notes (Signed)
 Follow-Up Visit   Subjective  Elizabeth Grant is a 48 y.o. female who presents for the following: Skin Cancer Screening and Full Body Skin Exam. Patient with history of seb derm, uses essential oils and when flares does help. Patient also would like place looked at R upper flank pt reports was removed a while back but gets very itchy and irritated.   The patient presents for Total-Body Skin Exam (TBSE) for skin cancer screening and mole check. The patient has spots, moles and lesions to be evaluated, some may be new or changing and the patient may have concern these could be cancer.   The following portions of the chart were reviewed this encounter and updated as appropriate: medications, allergies, medical history  Review of Systems:  No other skin or systemic complaints except as noted in HPI or Assessment and Plan.  Objective  Well appearing patient in no apparent distress; mood and affect are within normal limits.  A full examination was performed including scalp, head, eyes, ears, nose, lips, neck, chest, axillae, abdomen, back, buttocks, bilateral upper extremities, bilateral lower extremities, hands, feet, fingers, toes, fingernails, and toenails. All findings within normal limits unless otherwise noted below.   Relevant physical exam findings are noted in the Assessment and Plan.  Right upper flank Firm, pink nodule at right upper flank   Assessment & Plan   SKIN CANCER SCREENING PERFORMED TODAY.  ACTINIC DAMAGE - Chronic condition, secondary to cumulative UV/sun exposure - diffuse scaly erythematous macules with underlying dyspigmentation - Recommend daily broad spectrum sunscreen SPF 30+ to sun-exposed areas, reapply every 2 hours as needed.  - Staying in the shade or wearing long sleeves, sun glasses (UVA+UVB protection) and wide brim hats (4-inch brim around the entire circumference of the hat) are also recommended for sun protection.  - Call for new or changing  lesions.  LENTIGINES, SEBORRHEIC KERATOSES, HEMANGIOMAS - Benign normal skin lesions - Benign-appearing - Call for any changes  MELANOCYTIC NEVI - Tan-brown and/or pink-flesh-colored symmetric macules and papules - Benign appearing on exam today - Observation - Call clinic for new or changing moles - Recommend daily use of broad spectrum spf 30+ sunscreen to sun-exposed areas.    SEBORRHEIC DERMATITIS VS OTHER (CONTACT DERMATITIS, ECZEMA) Exam: mild erythema, no scale at occipital scalp   Chronic condition with duration or expected duration over one year. Currently well-controlled.   Seborrheic Dermatitis is a chronic persistent rash characterized by pinkness and scaling most commonly of the mid face but also can occur on the scalp (dandruff), ears; mid chest, mid back and groin.  It tends to be exacerbated by stress and cooler weather.  People who have neurologic disease may experience new onset or exacerbation of existing seborrheic dermatitis.  The condition is not curable but treatable and can be controlled.   Treatment Plan: Patient defers Rx treatment. Continue OTC Doterra essential oils since working well for her.   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 No cure. - Observe. - If desired, patient can use an emollient (moisturizer) containing ammonium lactate (AmLactin), urea or salicylic acid once a day to smooth the area  Recommend starting moisturizer with exfoliant (Urea, Salicylic acid, or Lactic acid) one to two times daily to help smooth rough and bumpy skin.  OTC options include Cetaphil Rough and Bumpy lotion (Urea), Eucerin Roughness Relief lotion or spot treatment cream (Urea), CeraVe SA lotion/cream for Rough and Bumpy skin (Sal Acid), Gold Bond Rough and  Bumpy cream (Sal Acid), and AmLactin 12% lotion/cream (Lactic Acid).  If applying in morning, also apply sunscreen to sun-exposed areas, since these exfoliating  moisturizers can increase sensitivity to sun.   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  Pt wishes to proceed. Intralesional injection - Right upper flank Location: R upper flank  Informed Consent: Discussed risks (infection, pain, bleeding, bruising, thinning of the skin, loss of skin pigment, lack of resolution, and recurrence of lesion) and benefits of the procedure, as well as the alternatives. Informed consent was obtained. Preparation: The area was prepared a standard fashion.  Procedure Details: An intralesional injection was performed with Kenalog 40 mg/cc. 0.1 cc in total were injected.  Total number of injections: 3  Plan: The patient was instructed on post-op care. Recommend OTC analgesia as needed for pain.  NDC: 16109-604-54 Lot: [id number] Exp: 06/17/2024  Discussed if still itching after this treatment, may require more than one treatment.   Related Medications triamcinolone acetonide (KENALOG-40) injection 40 mg  Return for 6-8 weeks f/u hypertrophic scar at right upper flank, w/ Dr. Annette Barters.  I, Jacquelynn Vera, CMA, am acting as scribe for Artemio Larry, MD .   Documentation: I have reviewed the above documentation for accuracy and completeness, and I agree with the above.  Artemio Larry, MD

## 2023-06-24 NOTE — Patient Instructions (Addendum)

## 2023-08-06 ENCOUNTER — Ambulatory Visit (INDEPENDENT_AMBULATORY_CARE_PROVIDER_SITE_OTHER): Admitting: Dermatology

## 2023-08-06 DIAGNOSIS — L910 Hypertrophic scar: Secondary | ICD-10-CM

## 2023-08-06 DIAGNOSIS — D1739 Benign lipomatous neoplasm of skin and subcutaneous tissue of other sites: Secondary | ICD-10-CM

## 2023-08-06 DIAGNOSIS — D241 Benign neoplasm of right breast: Secondary | ICD-10-CM

## 2023-08-06 MED ORDER — TRIAMCINOLONE ACETONIDE 10 MG/ML IJ SUSP
10.0000 mg | Freq: Once | INTRAMUSCULAR | Status: AC
Start: 2023-08-06 — End: 2023-08-06
  Administered 2023-08-06: 10 mg

## 2023-08-06 NOTE — Patient Instructions (Signed)

## 2023-08-06 NOTE — Progress Notes (Signed)
   Follow-Up Visit   Subjective  Elizabeth Grant is a 48 y.o. female who presents for the following: 6 week follow-up hypertrophic scar at right upper flank at area of bx-proven nevus lipomatous superficialis. Improved with ILK injection, no longer itchy, but still a little raised.  Worried it will grow back and get itchy again.   The following portions of the chart were reviewed this encounter and updated as appropriate: medications, allergies, medical history  Review of Systems:  No other skin or systemic complaints except as noted in HPI or Assessment and Plan.  Objective  Well appearing patient in no apparent distress; mood and affect are within normal limits.  A focused examination was performed of the following areas: Face, right upper flank  Relevant physical exam findings are noted in the Assessment and Plan.    Assessment & Plan   HYPERTROPHIC SCAR Exam: pink nodule, slightly firm - improving with ILK injection with decrease itching R upper flank/lateral breast   Treatment Plan:  Treat with intralesional triamcinolone .   Procedure Note Intralesional Injection Informed Consent: Discussed risks (infection, pain,- bleeding, bruising, thinning of the skin, loss of skin pigment, lack of resolution, and recurrence of lesion) and benefits of the procedure, as well as the alternatives. Informed consent was obtained. Preparation: The area was prepared a standard fashion. Anesthesia:none Procedure Details: An intralesional injection was performed with Kenalog  10 mg/cc. 0.1 cc in total were injected. NDC #: 2130-8657-84 Lot: [id number] Exp: 07/2025 Location: right upper flank Total number of injections: 1 The patient was instructed on post-op care. Recommend OTC analgesia as needed for pain.   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   HYPERTROPHIC SCAR   Related Medications triamcinolone   acetonide (KENALOG ) 10 MG/ML injection 10 mg   Return in about 11 months (around 07/05/2024) for TBSE.  IBernardine Bridegroom, CMA, am acting as scribe for Artemio Larry, MD .   Documentation: I have reviewed the above documentation for accuracy and completeness, and I agree with the above.  Artemio Larry, MD   -

## 2023-08-20 ENCOUNTER — Other Ambulatory Visit: Payer: Self-pay | Admitting: Internal Medicine

## 2023-08-20 DIAGNOSIS — Z1231 Encounter for screening mammogram for malignant neoplasm of breast: Secondary | ICD-10-CM

## 2023-09-11 ENCOUNTER — Other Ambulatory Visit: Payer: Self-pay | Admitting: Medical Genetics

## 2023-09-12 ENCOUNTER — Other Ambulatory Visit
Admission: RE | Admit: 2023-09-12 | Discharge: 2023-09-12 | Disposition: A | Discharge status: Home / Self Care | Payer: Self-pay | Source: Ambulatory Visit | Attending: Medical Genetics | Admitting: Medical Genetics

## 2023-09-17 ENCOUNTER — Ambulatory Visit
Admission: RE | Admit: 2023-09-17 | Discharge: 2023-09-17 | Disposition: A | Discharge status: Home / Self Care | Source: Ambulatory Visit | Attending: Internal Medicine | Admitting: Internal Medicine

## 2023-09-17 DIAGNOSIS — Z1231 Encounter for screening mammogram for malignant neoplasm of breast: Secondary | ICD-10-CM | POA: Diagnosis present

## 2023-09-26 LAB — GENECONNECT MOLECULAR SCREEN: Genetic Analysis Overall Interpretation: NEGATIVE

## 2024-07-05 ENCOUNTER — Encounter: Admitting: Dermatology
